# Patient Record
Sex: Female | Born: 1995 | Race: White | Hispanic: No | Smoking: Former smoker
Health system: Southern US, Community
[De-identification: ages and names within clinical notes are randomized; demographics above are authoritative.]

## PROBLEM LIST (undated history)

## (undated) DIAGNOSIS — F449 Dissociative and conversion disorder, unspecified: Secondary | ICD-10-CM

## (undated) DIAGNOSIS — M722 Plantar fascial fibromatosis: Secondary | ICD-10-CM

## (undated) DIAGNOSIS — R55 Syncope and collapse: Secondary | ICD-10-CM

## (undated) DIAGNOSIS — F419 Anxiety disorder, unspecified: Secondary | ICD-10-CM

## (undated) DIAGNOSIS — O99345 Other mental disorders complicating the puerperium: Secondary | ICD-10-CM

## (undated) DIAGNOSIS — I10 Essential (primary) hypertension: Secondary | ICD-10-CM

## (undated) DIAGNOSIS — G43909 Migraine, unspecified, not intractable, without status migrainosus: Secondary | ICD-10-CM

## (undated) DIAGNOSIS — M6281 Muscle weakness (generalized): Secondary | ICD-10-CM

## (undated) DIAGNOSIS — Z973 Presence of spectacles and contact lenses: Secondary | ICD-10-CM

## (undated) DIAGNOSIS — G894 Chronic pain syndrome: Secondary | ICD-10-CM

## (undated) DIAGNOSIS — G4700 Insomnia, unspecified: Secondary | ICD-10-CM

## (undated) DIAGNOSIS — M797 Fibromyalgia: Secondary | ICD-10-CM

## (undated) DIAGNOSIS — N3010 Interstitial cystitis (chronic) without hematuria: Secondary | ICD-10-CM

## (undated) DIAGNOSIS — F445 Conversion disorder with seizures or convulsions: Secondary | ICD-10-CM

## (undated) DIAGNOSIS — Z9989 Dependence on other enabling machines and devices: Secondary | ICD-10-CM

## (undated) DIAGNOSIS — F53 Postpartum depression: Secondary | ICD-10-CM

## (undated) DIAGNOSIS — J45909 Unspecified asthma, uncomplicated: Secondary | ICD-10-CM

## (undated) DIAGNOSIS — G629 Polyneuropathy, unspecified: Secondary | ICD-10-CM

## (undated) DIAGNOSIS — F4310 Post-traumatic stress disorder, unspecified: Secondary | ICD-10-CM

## (undated) DIAGNOSIS — R569 Unspecified convulsions: Secondary | ICD-10-CM

## (undated) HISTORY — DX: Polyneuropathy, unspecified: G62.9

## (undated) HISTORY — PX: CHOLECYSTECTOMY: SHX55

## (undated) HISTORY — PX: TUBAL LIGATION: SHX77

---
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
## 2013-12-24 ENCOUNTER — Other Ambulatory Visit: Payer: Self-pay

## 2013-12-24 ENCOUNTER — Encounter (HOSPITAL_COMMUNITY): Payer: Self-pay | Admitting: Emergency Medicine

## 2013-12-24 ENCOUNTER — Emergency Department (HOSPITAL_COMMUNITY)
Admission: EM | Admit: 2013-12-24 | Discharge: 2013-12-24 | Disposition: A | Discharge status: Home / Self Care | Payer: Self-pay | Attending: Emergency Medicine | Admitting: Emergency Medicine

## 2013-12-24 ENCOUNTER — Emergency Department (HOSPITAL_COMMUNITY): Payer: Medicaid Other

## 2013-12-24 DIAGNOSIS — Z3202 Encounter for pregnancy test, result negative: Secondary | ICD-10-CM | POA: Insufficient documentation

## 2013-12-24 DIAGNOSIS — R55 Syncope and collapse: Secondary | ICD-10-CM | POA: Insufficient documentation

## 2013-12-24 DIAGNOSIS — Z87891 Personal history of nicotine dependence: Secondary | ICD-10-CM | POA: Insufficient documentation

## 2013-12-24 DIAGNOSIS — Z8669 Personal history of other diseases of the nervous system and sense organs: Secondary | ICD-10-CM | POA: Insufficient documentation

## 2013-12-24 LAB — RAPID URINE DRUG SCREEN, HOSP PERFORMED
Amphetamines: NOT DETECTED
BARBITURATES: NOT DETECTED
Benzodiazepines: NOT DETECTED
Cocaine: NOT DETECTED
Opiates: NOT DETECTED
Tetrahydrocannabinol: NOT DETECTED

## 2013-12-24 LAB — COMPREHENSIVE METABOLIC PANEL
ALT: 14 U/L (ref 0–35)
AST: 20 U/L (ref 0–37)
Albumin: 4 g/dL (ref 3.5–5.2)
Alkaline Phosphatase: 65 U/L (ref 39–117)
BILIRUBIN TOTAL: 0.2 mg/dL — AB (ref 0.3–1.2)
BUN: 10 mg/dL (ref 6–23)
CHLORIDE: 100 meq/L (ref 96–112)
CO2: 30 meq/L (ref 19–32)
CREATININE: 0.69 mg/dL (ref 0.50–1.10)
Calcium: 9.1 mg/dL (ref 8.4–10.5)
GFR calc Af Amer: >90 mL/min (ref >90)
Glucose, Bld: 100 mg/dL — ABNORMAL HIGH (ref 70–99)
Potassium: 4.4 mEq/L (ref 3.7–5.3)
Sodium: 140 mEq/L (ref 137–147)
Total Protein: 7.8 g/dL (ref 6.0–8.3)

## 2013-12-24 LAB — CBC WITH DIFFERENTIAL/PLATELET
Basophils Absolute: 0 10*3/uL (ref 0.0–0.1)
Basophils Relative: 0 % (ref 0–1)
Eosinophils Absolute: 0.2 10*3/uL (ref 0.0–0.7)
Eosinophils Relative: 2 % (ref 0–5)
HCT: 40.5 % (ref 36.0–46.0)
Hemoglobin: 13.5 g/dL (ref 12.0–15.0)
LYMPHS PCT: 22 % (ref 12–46)
Lymphs Abs: 2.6 10*3/uL (ref 0.7–4.0)
MCH: 30.1 pg (ref 26.0–34.0)
MCHC: 33.3 g/dL (ref 30.0–36.0)
MCV: 90.4 fL (ref 78.0–100.0)
MONO ABS: 0.8 10*3/uL (ref 0.1–1.0)
Monocytes Relative: 7 % (ref 3–12)
NEUTROS ABS: 8.2 10*3/uL — AB (ref 1.7–7.7)
Neutrophils Relative %: 69 % (ref 43–77)
Platelets: 315 10*3/uL (ref 150–400)
RBC: 4.48 MIL/uL (ref 3.87–5.11)
RDW: 13.2 % (ref 11.5–15.5)
WBC: 11.8 10*3/uL — ABNORMAL HIGH (ref 4.0–10.5)

## 2013-12-24 LAB — URINALYSIS, ROUTINE W REFLEX MICROSCOPIC
BILIRUBIN URINE: NEGATIVE
GLUCOSE, UA: NEGATIVE mg/dL
Hgb urine dipstick: NEGATIVE
Ketones, ur: NEGATIVE mg/dL
Leukocytes, UA: NEGATIVE
Nitrite: NEGATIVE
PROTEIN: NEGATIVE mg/dL
Specific Gravity, Urine: 1.01 (ref 1.005–1.030)
Urobilinogen, UA: 0.2 mg/dL (ref 0.0–1.0)
pH: 6.5 (ref 5.0–8.0)

## 2013-12-24 LAB — HCG, QUANTITATIVE, PREGNANCY: hCG, Beta Chain, Quant, S: <1 m[IU]/mL (ref <5)

## 2013-12-24 LAB — PREGNANCY, URINE: PREG TEST UR: NEGATIVE

## 2013-12-24 LAB — D-DIMER, QUANTITATIVE: D-Dimer, Quant: <0.27 ug/mL-FEU (ref 0.00–0.48)

## 2013-12-24 LAB — TROPONIN I

## 2013-12-24 LAB — LIPASE, BLOOD: LIPASE: 40 U/L (ref 11–59)

## 2013-12-24 MED ORDER — IBUPROFEN 800 MG PO TABS
800.0000 mg | ORAL_TABLET | Freq: Once | ORAL | Status: AC
  Administered 2013-12-24: 800 mg via ORAL
  Filled 2013-12-24: qty 1

## 2013-12-24 MED ORDER — SODIUM CHLORIDE 0.9 % IV BOLUS (SEPSIS)
1000.0000 mL | Freq: Once | INTRAVENOUS | Status: AC
  Administered 2013-12-24: 1000 mL via INTRAVENOUS

## 2013-12-24 NOTE — ED Notes (Signed)
Patient is alert and oriented at this time. Talking and smiling at triage. Only complaint of pain is "I fell last night and hit my head on a big wooden TV."

## 2013-12-24 NOTE — ED Provider Notes (Signed)
CSN: [id number]     Arrival date & time 12/24/13  1956 History   First MD Initiated Contact with Patient 12/24/13 2011     Chief Complaint  Patient presents with  . Loss of Consciousness     (Consider location/radiation/quality/duration/timing/severity/associated sxs/prior Treatment) HPI Comments: Patient presents from home after syncopal episode. She states she was sitting on the ground and the next things she remembers she was lying on the ground. Her boyfriend states this is been going on for several months. She has had approximately 6 episodes of syncope in the past month. She is alert and oriented x3 now. She complains of a headache she hit her head last night when she passed out against the TV. She denies any past medical history. She did have a positive home pregnancy test 2 days ago. Last menstrual period was one month ago. She has a history of pseudoseizures and conversion disorder and used to be on medication by her report. No medications now. She denies any prodrome. No dizziness, lightheadedness, vision change, shortness of breath or chest pain. She does not have a doctor She reports no vomiting or diarrhea. Good by mouth intake..   Patient has vague when asked how long these episodes have been going on. She reports it was before Christmas. Her boyfriend reports he's been with her for one and a half months and she has "passed out" 6 or 7 times. She has not taken medication for pseudoseizures in 2 years. She reports having a previous echocardiogram, Holter monitor, EEG that were all unremarkable.  The history is provided by the patient, a relative and the EMS personnel.    Past Medical History  Diagnosis Date  . Seizures    History reviewed. No pertinent past surgical history. No family history on file. History  Substance Use Topics  . Smoking status: Former Research scientist (life sciences)  . Smokeless tobacco: Not on file  . Alcohol Use: No   OB History   Grav Para Term Preterm Abortions TAB SAB Ect  Mult Living   1              Review of Systems  Constitutional: Negative for fever, activity change and appetite change.  Respiratory: Negative for cough, chest tightness and shortness of breath.   Cardiovascular: Positive for syncope. Negative for chest pain.  Gastrointestinal: Negative for nausea, vomiting and abdominal pain.  Genitourinary: Negative for dysuria, hematuria, vaginal bleeding and vaginal discharge.  Musculoskeletal: Negative for arthralgias, back pain and myalgias.  Skin: Negative for rash.  Neurological: Negative for dizziness, weakness, light-headedness, numbness and headaches.  A complete 10 system review of systems was obtained and all systems are negative except as noted in the HPI and PMH.      Allergies  Review of patient's allergies indicates no known allergies.  Home Medications  No current outpatient prescriptions on file. BP 135/66  Pulse 102  Temp(Src) 98 F (36.7 C) (Oral)  Resp 16  Ht 5\' 4"  (1.626 m)  Wt 160 lb (72.576 kg)  BMI 27.45 kg/m2  SpO2 100%  LMP 11/01/2013  Breastfeeding? Unknown Physical Exam  Constitutional: She is oriented to person, place, and time. She appears well-developed and well-nourished. No distress.  HENT:  Head: Normocephalic and atraumatic.  Mouth/Throat: Oropharynx is clear and moist. No oropharyngeal exudate.  Eyes: Conjunctivae and EOM are normal. Pupils are equal, round, and reactive to light.  Neck: Normal range of motion. Neck supple.  Cardiovascular: Normal rate, regular rhythm and normal heart sounds.  No murmur heard. Pulmonary/Chest: Effort normal and breath sounds normal. No respiratory distress.  Abdominal: Soft. There is no tenderness. There is no rebound and no guarding.  Musculoskeletal: Normal range of motion. She exhibits no edema and no tenderness.  Neurological: She is alert and oriented to person, place, and time. No cranial nerve deficit. She exhibits normal muscle tone. Coordination normal.   CN 2-12 intact, no ataxia on finger to nose, no nystagmus, 5/5 strength throughout, no pronator drift, Romberg negative, normal gait.   Skin: Skin is warm.    ED Course  Procedures (including critical care time) Labs Review Labs Reviewed  CBC WITH DIFFERENTIAL - Abnormal; Notable for the following:    WBC 11.8 (*)    Neutro Abs 8.2 (*)    All other components within normal limits  COMPREHENSIVE METABOLIC PANEL - Abnormal; Notable for the following:    Glucose, Bld 100 (*)    Total Bilirubin 0.2 (*)    All other components within normal limits  URINALYSIS, ROUTINE W REFLEX MICROSCOPIC  PREGNANCY, URINE  LIPASE, BLOOD  TROPONIN I  HCG, QUANTITATIVE, PREGNANCY  D-DIMER, QUANTITATIVE  URINE RAPID DRUG SCREEN (HOSP PERFORMED)   Imaging Review Ct Head Wo Contrast  12/24/2013   CLINICAL DATA:  Syncope intermittently occurring for several months.  EXAM: CT HEAD WITHOUT CONTRAST  TECHNIQUE: Contiguous axial images were obtained from the base of the skull through the vertex without intravenous contrast.  COMPARISON:  CT HEAD W/O CM dated 12/06/2013; CT HEAD W/O CM dated 11/14/2011  FINDINGS: The ventricles and sulci are normal. No intraparenchymal hemorrhage, mass effect nor midline shift. No acute large vascular territory infarcts.  No abnormal extra-axial fluid collections. Basal cisterns are patent.  No skull fracture. Visualized paranasal sinuses and mastoid air-cells are well-aerated. The included ocular globes and orbital contents are non-suspicious. Soft tissue within the left external auditory canal likely reflect cerumen. Fullness of the nasopharyngeal soft tissues are attributable to patient's young age  IMPRESSION: No acute intracranial process ; normal noncontrast CT of the head, stable from December 06, 2013   Electronically Signed   By: Elon Alas   On: 12/24/2013 22:20    EKG Interpretation   None       MDM   Final diagnoses:  Syncope   multiple syncopal episodes  within the past two months without prodrome by report. History of conversion disorder and pseudoseizures. Possibly pregnant at this time. Denies preceding dizziness, lightheadedness, nausea, vision change, diaphoresis.  Nonfocal neuro exam. Vital stable. No distress. EKG shows no arrhythmia. No Brugada or prolonged QT.  Pregnancy test here is negative. Ddimer negative. Labs unremarkable. Orthostatics showed slight elevation in heart rate with standing but is not meet orthostatic hypotension criteria. Symptoms not reproduced with standing.  Unclear etiology of patient's "spells". Consider seizures, pseudoseizure, conversion disorder. Doubt arrhythmia. Doubt PE. No murmurs on exam. Patient reports previous echocardiogram was negative. Previous Holter monitoring and EEG were negative. Spells have been ongoing for several months if not longer. No evidence of acute change in symptomatology tonight. No evidence of Brugada syndrome or prolonged QT. Patient will be referred to both cardiology and neurology for further workup. Will need to establish care with PCP.   Date: 12/24/2013  Rate: 103  Rhythm: sinus tachycardia  QRS Axis: normal  Intervals: normal  ST/T Wave abnormalities: normal  Conduction Disutrbances:none  Narrative Interpretation: no brugada or prolonged QT  Old EKG Reviewed: none available    I personally performed the services described  in this documentation, which was scribed in my presence. The recorded information has been reviewed and is accurate.     Ezequiel Essex, MD 12/24/13 2258

## 2013-12-24 NOTE — Discharge Instructions (Signed)
Syncope There does not appear to be a life-threatening cause of your passing out. Followup with the cardiologist and neurologist for further testing. Establish care with a primary doctor. Return to the ED if you develop new or worsening symptoms. Syncope is a fainting spell. This means the person loses consciousness and drops to the ground. The person is generally unconscious for less than 5 minutes. The person may have some muscle twitches for up to 15 seconds before waking up and returning to normal. Syncope occurs more often in elderly people, but it can happen to anyone. While most causes of syncope are not dangerous, syncope can be a sign of a serious medical problem. It is important to seek medical care.  CAUSES  Syncope is caused by a sudden decrease in blood flow to the brain. The specific cause is often not determined. Factors that can trigger syncope include:  Taking medicines that lower blood pressure.  Sudden changes in posture, such as standing up suddenly.  Taking more medicine than prescribed.  Standing in one place for too long.  Seizure disorders.  Dehydration and excessive exposure to heat.  Low blood sugar (hypoglycemia).  Straining to have a bowel movement.  Heart disease, irregular heartbeat, or other circulatory problems.  Fear, emotional distress, seeing blood, or severe pain. SYMPTOMS  Right before fainting, you may:  Feel dizzy or lightheaded.  Feel nauseous.  See all white or all black in your field of vision.  Have cold, clammy skin. DIAGNOSIS  Your caregiver will ask about your symptoms, perform a physical exam, and perform electrocardiography (ECG) to record the electrical activity of your heart. Your caregiver may also perform other heart or blood tests to determine the cause of your syncope. TREATMENT  In most cases, no treatment is needed. Depending on the cause of your syncope, your caregiver may recommend changing or stopping some of your  medicines. HOME CARE INSTRUCTIONS  Have someone stay with you until you feel stable.  Do not drive, operate machinery, or play sports until your caregiver says it is okay.  Keep all follow-up appointments as directed by your caregiver.  Lie down right away if you start feeling like you might faint. Breathe deeply and steadily. Wait until all the symptoms have passed.  Drink enough fluids to keep your urine clear or pale yellow.  If you are taking blood pressure or heart medicine, get up slowly, taking several minutes to sit and then stand. This can reduce dizziness. SEEK IMMEDIATE MEDICAL CARE IF:   You have a severe headache.  You have unusual pain in the chest, abdomen, or back.  You are bleeding from the mouth or rectum, or you have black or tarry stool.  You have an irregular or very fast heartbeat.  You have pain with breathing.  You have repeated fainting or seizure-like jerking during an episode.  You faint when sitting or lying down.  You have confusion.  You have difficulty walking.  You have severe weakness.  You have vision problems. If you fainted, call your local emergency services (911 in [place] Do not drive yourself to the hospital.  MAKE SURE YOU:  Understand these instructions.  Will watch your condition.  Will get help right away if you are not doing well or get worse. Document Released: 10/17/2005 Document Revised: 04/17/2012 Document Reviewed: 12/16/2011 [facility] Patient Information 2014 [place].

## 2013-12-24 NOTE — ED Notes (Signed)
Patient presents to ER via [facility] with c/o syncope.  Per EMS, patient has no warning when she is going to have syncopal episode.   Patient states that this has been going on for several months and was seen at [facility] ED with no definitive answer.  Patient had witnessed syncopal episode tonight.  Patient is A&O on arrival to ED.

## 2014-01-05 ENCOUNTER — Emergency Department (HOSPITAL_COMMUNITY): Payer: Medicaid Other

## 2014-01-05 ENCOUNTER — Encounter (HOSPITAL_COMMUNITY): Payer: Self-pay | Admitting: Emergency Medicine

## 2014-01-05 ENCOUNTER — Emergency Department (HOSPITAL_COMMUNITY)
Admission: EM | Admit: 2014-01-05 | Discharge: 2014-01-05 | Disposition: A | Discharge status: Home / Self Care | Payer: Medicaid Other | Attending: Emergency Medicine | Admitting: Emergency Medicine

## 2014-01-05 DIAGNOSIS — Y939 Activity, unspecified: Secondary | ICD-10-CM | POA: Insufficient documentation

## 2014-01-05 DIAGNOSIS — Z87891 Personal history of nicotine dependence: Secondary | ICD-10-CM | POA: Insufficient documentation

## 2014-01-05 DIAGNOSIS — M542 Cervicalgia: Secondary | ICD-10-CM

## 2014-01-05 DIAGNOSIS — R55 Syncope and collapse: Secondary | ICD-10-CM

## 2014-01-05 DIAGNOSIS — IMO0002 Reserved for concepts with insufficient information to code with codable children: Secondary | ICD-10-CM | POA: Insufficient documentation

## 2014-01-05 DIAGNOSIS — H938X9 Other specified disorders of ear, unspecified ear: Secondary | ICD-10-CM | POA: Insufficient documentation

## 2014-01-05 DIAGNOSIS — Z3202 Encounter for pregnancy test, result negative: Secondary | ICD-10-CM | POA: Insufficient documentation

## 2014-01-05 DIAGNOSIS — S199XXA Unspecified injury of neck, initial encounter: Secondary | ICD-10-CM

## 2014-01-05 DIAGNOSIS — Y9289 Other specified places as the place of occurrence of the external cause: Secondary | ICD-10-CM | POA: Insufficient documentation

## 2014-01-05 DIAGNOSIS — Z8669 Personal history of other diseases of the nervous system and sense organs: Secondary | ICD-10-CM | POA: Insufficient documentation

## 2014-01-05 DIAGNOSIS — H538 Other visual disturbances: Secondary | ICD-10-CM | POA: Insufficient documentation

## 2014-01-05 DIAGNOSIS — R296 Repeated falls: Secondary | ICD-10-CM | POA: Insufficient documentation

## 2014-01-05 DIAGNOSIS — S0993XA Unspecified injury of face, initial encounter: Secondary | ICD-10-CM | POA: Insufficient documentation

## 2014-01-05 DIAGNOSIS — Z791 Long term (current) use of non-steroidal anti-inflammatories (NSAID): Secondary | ICD-10-CM | POA: Insufficient documentation

## 2014-01-05 DIAGNOSIS — M549 Dorsalgia, unspecified: Secondary | ICD-10-CM

## 2014-01-05 HISTORY — DX: Conversion disorder with seizures or convulsions: F44.5

## 2014-01-05 HISTORY — DX: Syncope and collapse: R55

## 2014-01-05 HISTORY — DX: Unspecified convulsions: R56.9

## 2014-01-05 HISTORY — DX: Dissociative and conversion disorder, unspecified: F44.9

## 2014-01-05 LAB — URINALYSIS, ROUTINE W REFLEX MICROSCOPIC
Bilirubin Urine: NEGATIVE
Glucose, UA: NEGATIVE mg/dL
Hgb urine dipstick: NEGATIVE
Ketones, ur: NEGATIVE mg/dL
Nitrite: NEGATIVE
Protein, ur: NEGATIVE mg/dL
Specific Gravity, Urine: 1.025 (ref 1.005–1.030)
UROBILINOGEN UA: 0.2 mg/dL (ref 0.0–1.0)
pH: 7 (ref 5.0–8.0)

## 2014-01-05 LAB — URINE MICROSCOPIC-ADD ON

## 2014-01-05 LAB — POC URINE PREG, ED: Preg Test, Ur: NEGATIVE

## 2014-01-05 MED ORDER — IBUPROFEN 800 MG PO TABS: 800.0000 mg | ORAL_TABLET | Freq: Three times a day (TID) | ORAL | Status: DC

## 2014-01-05 MED ORDER — IBUPROFEN 800 MG PO TABS
800.0000 mg | ORAL_TABLET | Freq: Once | ORAL | Status: AC
  Administered 2014-01-05: 800 mg via ORAL
  Filled 2014-01-05: qty 1

## 2014-01-05 MED ORDER — ACETAMINOPHEN 500 MG PO TABS: 500.0000 mg | ORAL_TABLET | Freq: Four times a day (QID) | ORAL | Status: DC | PRN

## 2014-01-05 NOTE — ED Notes (Signed)
Patient placed on continuous cardiac monitoring, continuous pulse 0x monitoring 

## 2014-01-05 NOTE — Discharge Instructions (Signed)
Please follow up with a primary care doctor from list below. Please follow up with the cardiologist and neurologist given to you at your last ER visit. Please take Tylenol and Motrin as prescribed for neck and back pain. Please read all discharge instructions and return precautions.   Syncope Syncope is a fainting spell. This means the person loses consciousness and drops to the ground. The person is generally unconscious for less than 5 minutes. The person may have some muscle twitches for up to 15 seconds before waking up and returning to normal. Syncope occurs more often in elderly people, but it can happen to anyone. While most causes of syncope are not dangerous, syncope can be a sign of a serious medical problem. It is important to seek medical care.  CAUSES  Syncope is caused by a sudden decrease in blood flow to the brain. The specific cause is often not determined. Factors that can trigger syncope include:  Taking medicines that lower blood pressure.  Sudden changes in posture, such as standing up suddenly.  Taking more medicine than prescribed.  Standing in one place for too long.  Seizure disorders.  Dehydration and excessive exposure to heat.  Low blood sugar (hypoglycemia).  Straining to have a bowel movement.  Heart disease, irregular heartbeat, or other circulatory problems.  Fear, emotional distress, seeing blood, or severe pain. SYMPTOMS  Right before fainting, you may:  Feel dizzy or lightheaded.  Feel nauseous.  See all white or all black in your field of vision.  Have cold, clammy skin. DIAGNOSIS  Your caregiver will ask about your symptoms, perform a physical exam, and perform electrocardiography (ECG) to record the electrical activity of your heart. Your caregiver may also perform other heart or blood tests to determine the cause of your syncope. TREATMENT  In most cases, no treatment is needed. Depending on the cause of your syncope, your caregiver may  recommend changing or stopping some of your medicines. HOME CARE INSTRUCTIONS  Have someone stay with you until you feel stable.  Do not drive, operate machinery, or play sports until your caregiver says it is okay.  Keep all follow-up appointments as directed by your caregiver.  Lie down right away if you start feeling like you might faint. Breathe deeply and steadily. Wait until all the symptoms have passed.  Drink enough fluids to keep your urine clear or pale yellow.  If you are taking blood pressure or heart medicine, get up slowly, taking several minutes to sit and then stand. This can reduce dizziness. SEEK IMMEDIATE MEDICAL CARE IF:   You have a severe headache.  You have unusual pain in the chest, abdomen, or back.  You are bleeding from the mouth or rectum, or you have black or tarry stool.  You have an irregular or very fast heartbeat.  You have pain with breathing.  You have repeated fainting or seizure-like jerking during an episode.  You faint when sitting or lying down.  You have confusion.  You have difficulty walking.  You have severe weakness.  You have vision problems. If you fainted, call your local emergency services (911 in [place] Do not drive yourself to the hospital.  MAKE SURE YOU:  Understand these instructions.  Will watch your condition.  Will get help right away if you are not doing well or get worse. Document Released: 10/17/2005 Document Revised: 04/17/2012 Document Reviewed: 12/16/2011 [facility] Patient Information 2014 [place].
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
## 2014-01-05 NOTE — ED Provider Notes (Signed)
Medical screening examination/treatment/procedure(s) were performed by non-physician practitioner and as supervising physician I was immediately available for consultation/collaboration.   EKG Interpretation None       Courtney F Horton, MD 01/05/14 1715 

## 2014-01-05 NOTE — ED Notes (Signed)
Pt reports was at [employer] yesterday and passed out in the parking lot and fell.  C/O pain to lower back, neck, and both shoulders.  Pt says doesn't remember anything since the fall until she woke up this morning.  Says when she woke up she couldn't see or hear anything.  Pt says now can see and hear but vision is blurry.  EMS reports was called out today for unresponsive.  Arrived to find pt nonverbal but responsive somewhat to ammonia and sternal rub.  EMS reports pt became verbal and was alert and oriented when got into the ambulance.  Pt says has conversion disorder and usually presents this way when she has an "episode."  Pt says the only difference this time is the visual changes.

## 2014-01-05 NOTE — ED Provider Notes (Signed)
CSN: [id number]     Arrival date & time 01/05/14  1406 History   First MD Initiated Contact with Patient 01/05/14 1417     Chief Complaint  Patient presents with  . Altered Mental Status     (Consider location/radiation/quality/duration/timing/severity/associated sxs/prior Treatment) HPI Comments: Patient is a 18 yo F PMHx significant for pseudoseizures, conversation disorder, syncope presenting to the ED for a syncopal episode last evening around 9:30PM while at [facility]. According to the patient's fiance the patient was in the parking lot when she fell to the ground. The patient states she does not remember the incident. She said she woke up this morning and noticed her hearing was muffled describing it as "water in my ears" and also endorses some blurred vision, she states she can still see that everything is not as "sharp as it is normally." Patient states that the syncopal episode is typical for her conversion disorder but the vision and hearing are new. Patient has had multiple episodes of syncope since being seen in the emergency department 2 weeks ago where she had an extensive negative workup done. Patient states she has not followed up with the cardiologist and/or neurologist has prescribed due to insurance reasons. She denies any chest pain, shortness of breath, abdominal pain, nausea, vomiting, diarrhea, numbness or weakness, eye pain. LMP was 2 months ago.  Patient is a 18 y.o. female presenting with altered mental status.  Altered Mental Status Associated symptoms: no abdominal pain, no fever, no nausea, no palpitations and no vomiting     Past Medical History  Diagnosis Date  . Pseudoseizures   . Conversion disorder   . Syncope     recurrent   History reviewed. No pertinent past surgical history. No family history on file. History  Substance Use Topics  . Smoking status: Former Research scientist (life sciences)  . Smokeless tobacco: Not on file  . Alcohol Use: No   OB History   Grav Para Term  Preterm Abortions TAB SAB Ect Mult Living   1              Review of Systems  Constitutional: Negative for fever and chills.  HENT:       Ear congestion  Eyes:       Blurred vision  Respiratory: Negative for cough, chest tightness and shortness of breath.   Cardiovascular: Negative for chest pain, palpitations and leg swelling.  Gastrointestinal: Negative for nausea, vomiting, abdominal pain and diarrhea.  Musculoskeletal: Positive for back pain and neck pain.  Neurological: Positive for syncope.  All other systems reviewed and are negative.      Allergies  Review of patient's allergies indicates no known allergies.  Home Medications   Current Outpatient Rx  Name  Route  Sig  Dispense  Refill  . diphenhydramine-acetaminophen (TYLENOL PM) 25-500 MG TABS   Oral   Take 1 tablet by mouth at bedtime as needed (sleep/pain).         Marland Kitchen acetaminophen (TYLENOL) 500 MG tablet   Oral   Take 1 tablet (500 mg total) by mouth every 6 (six) hours as needed.   30 tablet   0   . ibuprofen (ADVIL,MOTRIN) 800 MG tablet   Oral   Take 1 tablet (800 mg total) by mouth 3 (three) times daily.   21 tablet   0    BP 131/86  Pulse 96  Temp(Src) 98.1 F (36.7 C) (Oral)  Resp 25  Ht 5\' 4"  (1.626 m)  Wt 150 lb (68.04 kg)  BMI 25.73 kg/m2  SpO2 100%  LMP 11/01/2013 Physical Exam  Nursing note and vitals reviewed. Constitutional: She is oriented to person, place, and time. She appears well-developed and well-nourished. No distress.  HENT:  Head: Normocephalic and atraumatic.  Right Ear: Hearing, tympanic membrane, external ear and ear canal normal.  Left Ear: Hearing, tympanic membrane, external ear and ear canal normal.  Nose: Nose normal.  Mouth/Throat: Oropharynx is clear and moist. No oropharyngeal exudate.  Patient comprehending all questions appropriately, not asking for clarification. No evidence of decreased hearing.   Eyes: Conjunctivae, EOM and lids are normal. Pupils are  equal, round, and reactive to light. Right eye exhibits no chemosis, no discharge, no exudate and no hordeolum. No foreign body present in the right eye. Left eye exhibits no chemosis, no discharge, no exudate and no hordeolum. No foreign body present in the left eye. No scleral icterus.  Neck: Normal range of motion. Neck supple.  Cardiovascular: Normal rate, regular rhythm, normal heart sounds and intact distal pulses.   Pulmonary/Chest: Effort normal and breath sounds normal. No respiratory distress.  Abdominal: Soft. There is no tenderness.  Musculoskeletal: Normal range of motion. She exhibits no edema.  Neurological: She is alert and oriented to person, place, and time. She has normal strength. No cranial nerve deficit or sensory deficit. Gait normal. GCS eye subscore is 4. GCS verbal subscore is 5. GCS motor subscore is 6.  No pronator drift. Bilateral heel-knee-shin intact. Bilateral finger-nose-finger intact. Normal ambulation.  Skin: Skin is warm and dry. She is not diaphoretic.    ED Course  Procedures (including critical care time) Medications  ibuprofen (ADVIL,MOTRIN) tablet 800 mg (800 mg Oral Given 01/05/14 1445)    Labs Review Labs Reviewed  URINALYSIS, ROUTINE W REFLEX MICROSCOPIC - Abnormal; Notable for the following:    APPearance CLOUDY (*)    Leukocytes, UA TRACE (*)    All other components within normal limits  URINE MICROSCOPIC-ADD ON - Abnormal; Notable for the following:    Squamous Epithelial / LPF FEW (*)    Bacteria, UA FEW (*)    All other components within normal limits  POC URINE PREG, ED   Imaging Review Dg Cervical Spine Complete  01/05/2014   CLINICAL DATA:  Syncopal episode and neck pain.  EXAM: CERVICAL SPINE  4+ VIEWS  COMPARISON:  CT 03/16/2011  FINDINGS: There is no evidence of cervical spine fracture or prevertebral soft tissue swelling. Alignment is normal. No other significant bone abnormalities are identified.  IMPRESSION: Negative cervical spine  radiographs.   Electronically Signed   By: Markus Daft M.D.   On: 01/05/2014 15:42   Dg Lumbar Spine Complete  01/05/2014   CLINICAL DATA:  Lumbar pain after fall.  EXAM: LUMBAR SPINE - COMPLETE 4+ VIEW  COMPARISON:  03/29/2011  FINDINGS: Large amount of stool in abdomen. Normal alignment of the lumbar spine. The vertebral body heights are maintained. There is mild irregularity along the anterior inferior endplate of L5. This appears unchanged from the exam on 03/29/2011.  IMPRESSION: No acute bone abnormality in the lumbar spine.   Electronically Signed   By: Markus Daft M.D.   On: 01/05/2014 15:47     EKG Interpretation None      MDM   Final diagnoses:  Syncope  Neck pain  Back pain   Filed Vitals:   01/05/14 1414  BP: 131/86  Pulse: 96  Temp: 98.1 F (36.7 C)  Resp: 25    Afebrile, NAD, non-toxic  appearing, AAOx4.   4:13 PM Patient's fiance is asking about possible disability for the patient. He states "she can't work if she gets upset at someone she may have an episode..Also if me and my girl don't work out in the future I want to make sure she'll be set." Discussed with patient and fiance that she would require going through a PCP for this paperwork and this was not something that is done in the ED. They are agreeable to that. A resource guide will be provided to them.    Patient is a 18 yo F PMHx significant for conversion disorder, pseudoseizures, syncope presenting to the ED after a typical syncopal episode for her with new compliant of decreased vision and hearing. HEENT examination unremarkable. No acute abnormalities. No hearing loss appreciated during examination. VA is 20/70 in OD/OS, 20/50 bilateral. No gross deficit in [place]. No neurofocal deficits on examination. Paraspinous muscles are tender, but not posterior midline tenderness noted. No stepoffs. Patient was seen in ED on 12/24/13 with extensive work up including CT scans, blood work, EKG, Troponin, D-dimer, negative  pregnancy test, UDS without acute finding. EKG is again unremarkable today. NSR, no signs of arrhythmia or Brugadas. Negative pregnancy test. Xrays negative for acute process. Pain managed in ED. Do not feel patient will require further evaluation at this time. Symptoms managed in ED. Advised cardiology and neurology follow up again. Also advised PCP f/u. Patient is agreeable to plan. Patient is stable at time of discharge. Patient d/w with Dr. Dina Rich, agrees with plan.         Harlow Mares, PA-C 01/05/14 1629

## 2014-01-05 NOTE — ED Notes (Signed)
Patient ambulatory to restroom with assistance, clean catch instructions given and advised pt to bring specimen back to room as well.

## 2014-03-23 ENCOUNTER — Encounter (HOSPITAL_COMMUNITY): Payer: Self-pay | Admitting: Emergency Medicine

## 2014-03-23 ENCOUNTER — Emergency Department (HOSPITAL_COMMUNITY)
Admission: EM | Admit: 2014-03-23 | Discharge: 2014-03-24 | Disposition: A | Discharge status: Home / Self Care | Payer: Medicaid Other | Attending: Emergency Medicine | Admitting: Emergency Medicine

## 2014-03-23 DIAGNOSIS — R1031 Right lower quadrant pain: Secondary | ICD-10-CM | POA: Insufficient documentation

## 2014-03-23 DIAGNOSIS — R109 Unspecified abdominal pain: Secondary | ICD-10-CM

## 2014-03-23 DIAGNOSIS — Z791 Long term (current) use of non-steroidal anti-inflammatories (NSAID): Secondary | ICD-10-CM | POA: Insufficient documentation

## 2014-03-23 DIAGNOSIS — Z87891 Personal history of nicotine dependence: Secondary | ICD-10-CM | POA: Insufficient documentation

## 2014-03-23 DIAGNOSIS — Z3202 Encounter for pregnancy test, result negative: Secondary | ICD-10-CM | POA: Insufficient documentation

## 2014-03-23 DIAGNOSIS — R112 Nausea with vomiting, unspecified: Secondary | ICD-10-CM

## 2014-03-23 LAB — URINALYSIS, ROUTINE W REFLEX MICROSCOPIC
BILIRUBIN URINE: NEGATIVE
GLUCOSE, UA: NEGATIVE mg/dL
Hgb urine dipstick: NEGATIVE
KETONES UR: NEGATIVE mg/dL
Leukocytes, UA: NEGATIVE
NITRITE: NEGATIVE
PH: 6.5 (ref 5.0–8.0)
Protein, ur: NEGATIVE mg/dL
SPECIFIC GRAVITY, URINE: 1.015 (ref 1.005–1.030)
Urobilinogen, UA: 0.2 mg/dL (ref 0.0–1.0)

## 2014-03-23 LAB — CBC WITH DIFFERENTIAL/PLATELET
BASOS ABS: 0.1 10*3/uL (ref 0.0–0.1)
BASOS PCT: 1 % (ref 0–1)
Eosinophils Absolute: 0.2 10*3/uL (ref 0.0–0.7)
Eosinophils Relative: 2 % (ref 0–5)
HEMATOCRIT: 37.8 % (ref 36.0–46.0)
Hemoglobin: 12.3 g/dL (ref 12.0–15.0)
Lymphocytes Relative: 42 % (ref 12–46)
Lymphs Abs: 3.7 10*3/uL (ref 0.7–4.0)
MCH: 28.3 pg (ref 26.0–34.0)
MCHC: 32.5 g/dL (ref 30.0–36.0)
MCV: 87.1 fL (ref 78.0–100.0)
Monocytes Absolute: 0.5 10*3/uL (ref 0.1–1.0)
Monocytes Relative: 6 % (ref 3–12)
NEUTROS ABS: 4.4 10*3/uL (ref 1.7–7.7)
Neutrophils Relative %: 49 % (ref 43–77)
PLATELETS: 330 10*3/uL (ref 150–400)
RBC: 4.34 MIL/uL (ref 3.87–5.11)
RDW: 12.8 % (ref 11.5–15.5)
WBC: 9 10*3/uL (ref 4.0–10.5)

## 2014-03-23 LAB — COMPREHENSIVE METABOLIC PANEL
ALBUMIN: 3.8 g/dL (ref 3.5–5.2)
ALT: 16 U/L (ref 0–35)
AST: 17 U/L (ref 0–37)
Alkaline Phosphatase: 58 U/L (ref 39–117)
BUN: 12 mg/dL (ref 6–23)
CHLORIDE: 100 meq/L (ref 96–112)
CO2: 24 meq/L (ref 19–32)
Calcium: 8.8 mg/dL (ref 8.4–10.5)
Creatinine, Ser: 0.83 mg/dL (ref 0.50–1.10)
GFR calc Af Amer: >90 mL/min (ref >90)
Glucose, Bld: 95 mg/dL (ref 70–99)
Potassium: 3.8 mEq/L (ref 3.7–5.3)
SODIUM: 136 meq/L — AB (ref 137–147)
Total Protein: 6.9 g/dL (ref 6.0–8.3)

## 2014-03-23 LAB — POC URINE PREG, ED: Preg Test, Ur: NEGATIVE

## 2014-03-23 LAB — LIPASE, BLOOD: Lipase: 38 U/L (ref 11–59)

## 2014-03-23 MED ORDER — OXYCODONE-ACETAMINOPHEN 5-325 MG PO TABS: 1.0000 | ORAL_TABLET | ORAL | Status: DC | PRN

## 2014-03-23 MED ORDER — SODIUM CHLORIDE 0.9 % IV SOLN: INTRAVENOUS | Status: DC

## 2014-03-23 MED ORDER — SODIUM CHLORIDE 0.9 % IV BOLUS (SEPSIS)
1000.0000 mL | Freq: Once | INTRAVENOUS | Status: AC
  Administered 2014-03-23: 1000 mL via INTRAVENOUS

## 2014-03-23 MED ORDER — ONDANSETRON HCL 4 MG/2ML IJ SOLN
4.0000 mg | Freq: Once | INTRAMUSCULAR | Status: AC
  Administered 2014-03-23: 4 mg via INTRAVENOUS
  Filled 2014-03-23: qty 2

## 2014-03-23 MED ORDER — OXYCODONE-ACETAMINOPHEN 5-325 MG PO TABS
2.0000 | ORAL_TABLET | Freq: Once | ORAL | Status: AC
  Administered 2014-03-23: 2 via ORAL
  Filled 2014-03-23: qty 2

## 2014-03-23 MED ORDER — NAPROXEN 500 MG PO TABS: 500.0000 mg | ORAL_TABLET | Freq: Two times a day (BID) | ORAL | Status: DC

## 2014-03-23 NOTE — ED Notes (Signed)
Vomiting since 1pm, states there is dark red blood in vomit

## 2014-03-23 NOTE — Discharge Instructions (Signed)
Please call your doctor for a followup appointment within 24-48 hours. When you talk to your doctor please let them know that you were seen in the emergency department and have them acquire all of your records so that they can discuss the findings with you and formulate a treatment plan to fully care for your new and ongoing problems. ° °

## 2014-03-23 NOTE — ED Notes (Signed)
MD at bedside. 

## 2014-03-23 NOTE — ED Provider Notes (Signed)
CSN: [id number]     Arrival date & time 03/23/14  2152 History  This chart was scribed for Grace Acosta, MD by Roxan Diesel, ED scribe.  This patient was seen in room [facility] and the patient's care was started at 11:27 PM.   Chief Complaint  Patient presents with  . Emesis    The history is provided by the patient. No language interpreter was used.    HPI Comments: Grace Sheppard is a 18 y.o. female who presents to the Emergency Department complaining of vomiting that began at 1 PM with associated abdominal pain.  Pt states she has had an intermittent headache for several days which was relieved by Excedrin.  She felt well on waking up this morning.and after eating eggs for breakfast this morning she continued to feel fine throughout church and when she arrived home at 12:30 PM.  However at 1 PM she became nauseated and began vomiting.  She has continued vomiting throughout the day and has noticed some dark red blood in her vomit as well.  She has been unable to tolerate foods or fluid.  After she began vomiting she also developed moderate sharp abdominal pain localized under her left ribs that radiates around to her right lower back.  Pain is worsened by getting up and walking and subsides slightly when she lies down.  Currently she rates pain at 5/10.  Pt also reports associated mild difficulty urinating and mild sore throat.  She denies fevers, chills, cough, SOB, diarrhea, blood in stools, leg swelling, changes in vision, hematuria, or vaginal discharge.  She denies h/o abdominal surgery.   Past Medical History  Diagnosis Date  . Pseudoseizures   . Conversion disorder   . Syncope     recurrent    History reviewed. No pertinent past surgical history.  No family history on file.   History  Substance Use Topics  . Smoking status: Former Research scientist (life sciences)  . Smokeless tobacco: Not on file  . Alcohol Use: No    OB History   Grav Para Term Preterm Abortions TAB SAB Ect Mult Living   1                Review of Systems  All other systems reviewed and are negative.     Allergies  Review of patient's allergies indicates no known allergies.  Home Medications   Prior to Admission medications   Medication Sig Start Date End Date Taking? Authorizing Provider  acetaminophen (TYLENOL) 500 MG tablet Take 1 tablet (500 mg total) by mouth every 6 (six) hours as needed. 01/05/14   Jennifer L Piepenbrink, PA-C  diphenhydramine-acetaminophen (TYLENOL PM) 25-500 MG TABS Take 1 tablet by mouth at bedtime as needed (sleep/pain).    Historical Provider, MD  ibuprofen (ADVIL,MOTRIN) 800 MG tablet Take 1 tablet (800 mg total) by mouth 3 (three) times daily. 01/05/14   Jennifer L Piepenbrink, PA-C   BP 118/74  Pulse 89  Temp(Src) 98.1 F (36.7 C) (Oral)  Resp 18  Ht 5\' 4"  (1.626 m)  Wt 158 lb (71.668 kg)  BMI 27.11 kg/m2  SpO2 99%  LMP 10/31/2013  Breastfeeding? No  Physical Exam  Nursing note and vitals reviewed. Constitutional: She appears well-developed and well-nourished. No distress.  HENT:  Head: Normocephalic and atraumatic.  Mouth/Throat: Oropharynx is clear and moist. No oropharyngeal exudate.  Eyes: Conjunctivae and EOM are normal. Pupils are equal, round, and reactive to light. Right eye exhibits no discharge. Left eye exhibits no discharge.  No scleral icterus.  Neck: Normal range of motion. Neck supple. No JVD present. No thyromegaly present.  Cardiovascular: Normal rate, regular rhythm, normal heart sounds and intact distal pulses.  Exam reveals no gallop and no friction rub.   No murmur heard. Pulmonary/Chest: Effort normal and breath sounds normal. No respiratory distress. She has no wheezes. She has no rales.  Abdominal: Soft. Bowel sounds are normal. She exhibits no distension and no mass. There is tenderness. There is no guarding.  Minimal tenderness in right lower quadrant, no guarding. Very soft. Mild right CVA tenderness.  Musculoskeletal: Normal range  of motion. She exhibits no edema and no tenderness.  Lymphadenopathy:    She has no cervical adenopathy.  Neurological: She is alert. Coordination normal.  Skin: Skin is warm and dry. No rash noted. No erythema.  Psychiatric: She has a normal mood and affect. Her behavior is normal.    ED Course  Procedures (including critical care time)  DIAGNOSTIC STUDIES: Oxygen Saturation is 99% on room air, normal by my interpretation.    COORDINATION OF CARE: 11:33 PM-Discussed treatment plan which includes pain medication with pt at bedside and pt agreed to plan.     Labs Review Labs Reviewed  COMPREHENSIVE METABOLIC PANEL - Abnormal; Notable for the following:    Sodium 136 (*)    Total Bilirubin <0.2 (*)    All other components within normal limits  CBC WITH DIFFERENTIAL  LIPASE, BLOOD  URINALYSIS, ROUTINE W REFLEX MICROSCOPIC  POC URINE PREG, ED    Imaging Review No results found.   MDM   Final diagnoses:  Abdominal pain  Nausea and vomiting    Pt has some mild RLQ ttp but declines CT scan and has opted to return in the AM for a CT if still having pain - she has normal labs and VS and at this time - states sx have almost completely resolved.  Doubt GB or liver issues, no pancreatitis - not pregnant.  Meds given in ED:  Medications  0.9 %  sodium chloride infusion (not administered)  oxyCODONE-acetaminophen (PERCOCET/ROXICET) 5-325 MG per tablet 2 tablet (not administered)  sodium chloride 0.9 % bolus 1,000 mL (1,000 mLs Intravenous New Bag/Given 03/23/14 2244)  ondansetron (ZOFRAN) injection 4 mg (4 mg Intravenous Given 03/23/14 2244)    New Prescriptions   NAPROXEN (NAPROSYN) 500 MG TABLET    Take 1 tablet (500 mg total) by mouth 2 (two) times daily with a meal.   OXYCODONE-ACETAMINOPHEN (PERCOCET) 5-325 MG PER TABLET    Take 1 tablet by mouth every 4 (four) hours as needed.      I personally performed the services described in this documentation, which was  scribed in my presence. The recorded information has been reviewed and is accurate.      Grace Acosta, MD 03/23/14 [phone]

## 2014-09-01 ENCOUNTER — Encounter (HOSPITAL_COMMUNITY): Payer: Self-pay | Admitting: Emergency Medicine

## 2015-07-27 ENCOUNTER — Emergency Department (HOSPITAL_COMMUNITY)
Admission: EM | Admit: 2015-07-27 | Discharge: 2015-07-27 | Disposition: A | Discharge status: Home / Self Care | Payer: Medicaid Other | Attending: Emergency Medicine | Admitting: Emergency Medicine

## 2015-07-27 ENCOUNTER — Encounter (HOSPITAL_COMMUNITY): Payer: Self-pay | Admitting: *Deleted

## 2015-07-27 DIAGNOSIS — R3915 Urgency of urination: Secondary | ICD-10-CM | POA: Insufficient documentation

## 2015-07-27 DIAGNOSIS — R300 Dysuria: Secondary | ICD-10-CM | POA: Insufficient documentation

## 2015-07-27 DIAGNOSIS — R102 Pelvic and perineal pain: Secondary | ICD-10-CM | POA: Insufficient documentation

## 2015-07-27 DIAGNOSIS — Z791 Long term (current) use of non-steroidal anti-inflammatories (NSAID): Secondary | ICD-10-CM | POA: Insufficient documentation

## 2015-07-27 DIAGNOSIS — Z87891 Personal history of nicotine dependence: Secondary | ICD-10-CM | POA: Insufficient documentation

## 2015-07-27 DIAGNOSIS — N939 Abnormal uterine and vaginal bleeding, unspecified: Secondary | ICD-10-CM | POA: Insufficient documentation

## 2015-07-27 DIAGNOSIS — R110 Nausea: Secondary | ICD-10-CM | POA: Insufficient documentation

## 2015-07-27 DIAGNOSIS — Z3202 Encounter for pregnancy test, result negative: Secondary | ICD-10-CM | POA: Insufficient documentation

## 2015-07-27 DIAGNOSIS — R350 Frequency of micturition: Secondary | ICD-10-CM | POA: Insufficient documentation

## 2015-07-27 DIAGNOSIS — Z8659 Personal history of other mental and behavioral disorders: Secondary | ICD-10-CM | POA: Insufficient documentation

## 2015-07-27 DIAGNOSIS — R21 Rash and other nonspecific skin eruption: Secondary | ICD-10-CM | POA: Insufficient documentation

## 2015-07-27 DIAGNOSIS — Z975 Presence of (intrauterine) contraceptive device: Secondary | ICD-10-CM | POA: Insufficient documentation

## 2015-07-27 LAB — URINALYSIS, ROUTINE W REFLEX MICROSCOPIC
Bilirubin Urine: NEGATIVE
GLUCOSE, UA: NEGATIVE mg/dL
HGB URINE DIPSTICK: NEGATIVE
KETONES UR: NEGATIVE mg/dL
LEUKOCYTES UA: NEGATIVE
Nitrite: NEGATIVE
PROTEIN: NEGATIVE mg/dL
Specific Gravity, Urine: 1.02 (ref 1.005–1.030)
Urobilinogen, UA: 0.2 mg/dL (ref 0.0–1.0)
pH: 7 (ref 5.0–8.0)

## 2015-07-27 LAB — CBC WITH DIFFERENTIAL/PLATELET
Basophils Absolute: 0 10*3/uL (ref 0.0–0.1)
Basophils Relative: 0 %
EOS ABS: 0.2 10*3/uL (ref 0.0–0.7)
Eosinophils Relative: 2 %
HCT: 39.4 % (ref 36.0–46.0)
Hemoglobin: 12.7 g/dL (ref 12.0–15.0)
LYMPHS ABS: 3.5 10*3/uL (ref 0.7–4.0)
Lymphocytes Relative: 38 %
MCH: 27.1 pg (ref 26.0–34.0)
MCHC: 32.2 g/dL (ref 30.0–36.0)
MCV: 84 fL (ref 78.0–100.0)
Monocytes Absolute: 0.5 10*3/uL (ref 0.1–1.0)
Monocytes Relative: 5 %
Neutro Abs: 4.9 10*3/uL (ref 1.7–7.7)
Neutrophils Relative %: 55 %
Platelets: 320 10*3/uL (ref 150–400)
RBC: 4.69 MIL/uL (ref 3.87–5.11)
RDW: 14.5 % (ref 11.5–15.5)
WBC: 9.2 10*3/uL (ref 4.0–10.5)

## 2015-07-27 LAB — COMPREHENSIVE METABOLIC PANEL
ALK PHOS: 52 U/L (ref 38–126)
ALT: 26 U/L (ref 14–54)
AST: 22 U/L (ref 15–41)
Albumin: 4.7 g/dL (ref 3.5–5.0)
Anion gap: 9 (ref 5–15)
BILIRUBIN TOTAL: 0.3 mg/dL (ref 0.3–1.2)
BUN: 14 mg/dL (ref 6–20)
CO2: 26 mmol/L (ref 22–32)
CREATININE: 0.69 mg/dL (ref 0.44–1.00)
Calcium: 9.1 mg/dL (ref 8.9–10.3)
Chloride: 102 mmol/L (ref 101–111)
Glucose, Bld: 89 mg/dL (ref 65–99)
Potassium: 4 mmol/L (ref 3.5–5.1)
Sodium: 137 mmol/L (ref 135–145)
Total Protein: 7.9 g/dL (ref 6.5–8.1)

## 2015-07-27 LAB — POC URINE PREG, ED: Preg Test, Ur: NEGATIVE

## 2015-07-27 LAB — WET PREP, GENITAL
Clue Cells Wet Prep HPF POC: NONE SEEN
TRICH WET PREP: NONE SEEN
Yeast Wet Prep HPF POC: NONE SEEN

## 2015-07-27 LAB — LIPASE, BLOOD: LIPASE: 36 U/L (ref 22–51)

## 2015-07-27 MED ORDER — AZITHROMYCIN 1 G PO PACK
1.0000 g | PACK | Freq: Once | ORAL | Status: AC
  Administered 2015-07-27: 1 g via ORAL
  Filled 2015-07-27: qty 1

## 2015-07-27 MED ORDER — CEFTRIAXONE SODIUM 250 MG IJ SOLR
250.0000 mg | Freq: Once | INTRAMUSCULAR | Status: AC
  Administered 2015-07-27: 250 mg via INTRAMUSCULAR
  Filled 2015-07-27: qty 250

## 2015-07-27 MED ORDER — LIDOCAINE HCL (PF) 1 % IJ SOLN
INTRAMUSCULAR | Status: AC
  Administered 2015-07-27: 5 mL
  Filled 2015-07-27: qty 5

## 2015-07-27 MED ORDER — MORPHINE SULFATE (PF) 4 MG/ML IV SOLN
4.0000 mg | Freq: Once | INTRAVENOUS | Status: AC
  Administered 2015-07-27: 4 mg via INTRAVENOUS
  Filled 2015-07-27: qty 1

## 2015-07-27 MED ORDER — DOXYCYCLINE HYCLATE 100 MG PO CAPS: 100.0000 mg | ORAL_CAPSULE | Freq: Two times a day (BID) | ORAL | Status: DC

## 2015-07-27 MED ORDER — TRAMADOL HCL 50 MG PO TABS: 50.0000 mg | ORAL_TABLET | Freq: Four times a day (QID) | ORAL | Status: DC | PRN

## 2015-07-27 MED ORDER — DOXYCYCLINE HYCLATE 100 MG PO TABS
100.0000 mg | ORAL_TABLET | Freq: Once | ORAL | Status: AC
  Administered 2015-07-27: 100 mg via ORAL
  Filled 2015-07-27: qty 1

## 2015-07-27 MED ORDER — ONDANSETRON HCL 4 MG/2ML IJ SOLN
4.0000 mg | Freq: Once | INTRAMUSCULAR | Status: AC
  Administered 2015-07-27: 4 mg via INTRAVENOUS
  Filled 2015-07-27: qty 2

## 2015-07-27 NOTE — ED Provider Notes (Signed)
CSN: [id number]     Arrival date & time 07/27/15  0118 History   First MD Initiated Contact with Patient 07/27/15 0135     Chief Complaint  Patient presents with  . Abdominal Pain     (Consider location/radiation/quality/duration/timing/severity/associated sxs/prior Treatment) Patient is a 19 y.o. female presenting with abdominal pain. The history is provided by the patient.  Abdominal Pain She complains of pain across her suprapubic area since this morning. Pain is sharp and crampy and she rates it at 5/10. It is worse with standing but nothing makes it better. There is associated nausea but no vomiting. She had some mild vaginal bleeding this morning but that has resolved. She denies any vaginal discharge. There has been some dysuria and urinary urgency and frequency. She denies fever, chills, sweats. She has not taken anything for pain. She is 3 months postpartum and states that she has not had normal menses since having baby. She does have a Mirena ring IUD in place.  Separate complaint, she has had an itchy rash on her arms and legs for the last 3 months. She denies any unusual exposures. She has not tried any treatment for it.  Past Medical History  Diagnosis Date  . Pseudoseizures   . Conversion disorder   . Syncope     recurrent   History reviewed. No pertinent past surgical history. No family history on file. Social History  Substance Use Topics  . Smoking status: Former Research scientist (life sciences)  . Smokeless tobacco: None  . Alcohol Use: No   OB History    Gravida Para Term Preterm AB TAB SAB Ectopic Multiple Living   1              Review of Systems  Gastrointestinal: Positive for abdominal pain.  All other systems reviewed and are negative.     Allergies  Review of patient's allergies indicates no known allergies.  Home Medications   Prior to Admission medications   Medication Sig Start Date End Date Taking? Authorizing Provider  diphenhydramine-acetaminophen (TYLENOL PM)  25-500 MG TABS Take 1 tablet by mouth at bedtime as needed (sleep/pain).    Historical Provider, MD  naproxen (NAPROSYN) 500 MG tablet Take 1 tablet (500 mg total) by mouth 2 (two) times daily with a meal. 03/23/14   Noemi Chapel, MD  oxyCODONE-acetaminophen (PERCOCET) 5-325 MG per tablet Take 1 tablet by mouth every 4 (four) hours as needed. 03/23/14   Noemi Chapel, MD   BP 147/103 mmHg  Pulse 78  Temp(Src) 97.8 F (36.6 C) (Oral)  Resp 18  Ht 5\' 4"  (1.626 m)  Wt 170 lb (77.111 kg)  BMI 29.17 kg/m2  SpO2 99%  LMP 07/26/2015 Physical Exam  Nursing note and vitals reviewed.  19 year old female, resting comfortably and in no acute distress. Vital signs are significant for hypertension. Oxygen saturation is 99%, which is normal. Head is normocephalic and atraumatic. PERRLA, EOMI. Oropharynx is clear. Neck is nontender and supple without adenopathy or JVD. Back is nontender and there is no CVA tenderness. Lungs are clear without rales, wheezes, or rhonchi. Chest is nontender. Heart has regular rate and rhythm without murmur. Abdomen is soft, flat, with mild tenderness in the suprapubic and right suprapubic areas. There is no rebound or guarding. There are no masses or hepatosplenomegaly and peristalsis is hypoactive. Pelvic: Normal external female genitalia. On speculum exam, IUD is noted to have been extruded. There is a small amount of white vaginal discharge present. On bimanual exam, there  is mild cervical motion tenderness and also tenderness of the right adnexa. No adnexal masses are felt. Fundus is normal size and position. Extremities have no cyanosis or edema, full range of motion is present. Skin is warm and dry. Maculopapular rashes present on arms and legs but very nonspecific in appearance. Neurologic: Mental status is normal, cranial nerves are intact, there are no motor or sensory deficits.  ED Course  Procedures (including critical care time) Labs Review Results for orders  placed or performed during the hospital encounter of 07/27/15  Wet prep, genital  Result Value Ref Range   Yeast Wet Prep HPF POC NONE SEEN NONE SEEN   Trich, Wet Prep NONE SEEN NONE SEEN   Clue Cells Wet Prep HPF POC NONE SEEN NONE SEEN   WBC, Wet Prep HPF POC FEW (A) NONE SEEN  Comprehensive metabolic panel  Result Value Ref Range   Sodium 137 135 - 145 mmol/L   Potassium 4.0 3.5 - 5.1 mmol/L   Chloride 102 101 - 111 mmol/L   CO2 26 22 - 32 mmol/L   Glucose, Bld 89 65 - 99 mg/dL   BUN 14 6 - 20 mg/dL   Creatinine, Ser 0.69 0.44 - 1.00 mg/dL   Calcium 9.1 8.9 - 10.3 mg/dL   Total Protein 7.9 6.5 - 8.1 g/dL   Albumin 4.7 3.5 - 5.0 g/dL   AST 22 15 - 41 U/L   ALT 26 14 - 54 U/L   Alkaline Phosphatase 52 38 - 126 U/L   Total Bilirubin 0.3 0.3 - 1.2 mg/dL   GFR calc non Af Amer >60 >60 mL/min   GFR calc Af Amer >60 >60 mL/min   Anion gap 9 5 - 15  Lipase, blood  Result Value Ref Range   Lipase 36 22 - 51 U/L  CBC with Differential  Result Value Ref Range   WBC 9.2 4.0 - 10.5 K/uL   RBC 4.69 3.87 - 5.11 MIL/uL   Hemoglobin 12.7 12.0 - 15.0 g/dL   HCT 39.4 36.0 - 46.0 %   MCV 84.0 78.0 - 100.0 fL   MCH 27.1 26.0 - 34.0 pg   MCHC 32.2 30.0 - 36.0 g/dL   RDW 14.5 11.5 - 15.5 %   Platelets 320 150 - 400 K/uL   Neutrophils Relative % 55 %   Neutro Abs 4.9 1.7 - 7.7 K/uL   Lymphocytes Relative 38 %   Lymphs Abs 3.5 0.7 - 4.0 K/uL   Monocytes Relative 5 %   Monocytes Absolute 0.5 0.1 - 1.0 K/uL   Eosinophils Relative 2 %   Eosinophils Absolute 0.2 0.0 - 0.7 K/uL   Basophils Relative 0 %   Basophils Absolute 0.0 0.0 - 0.1 K/uL  Urinalysis, Routine w reflex microscopic  Result Value Ref Range   Color, Urine YELLOW YELLOW   APPearance HAZY (A) CLEAR   Specific Gravity, Urine 1.020 1.005 - 1.030   pH 7.0 5.0 - 8.0   Glucose, UA NEGATIVE NEGATIVE mg/dL   Hgb urine dipstick NEGATIVE NEGATIVE   Bilirubin Urine NEGATIVE NEGATIVE   Ketones, ur NEGATIVE NEGATIVE mg/dL    Protein, ur NEGATIVE NEGATIVE mg/dL   Urobilinogen, UA 0.2 0.0 - 1.0 mg/dL   Nitrite NEGATIVE NEGATIVE   Leukocytes, UA NEGATIVE NEGATIVE  POC urine preg, ED  Result Value Ref Range   Preg Test, Ur NEGATIVE NEGATIVE   I have personally reviewed and evaluated these lab results as part of my medical decision-making.  MDM   Final diagnoses:  Pelvic pain in female    Pelvic pain of uncertain cause. Urinalysis will be obtained to evaluate for possible UTI. Tenderness is significantly lower than what could be expected for appendicitis. Old records are reviewed and she had a similar presentation one year ago and refuses CT scan at that time but symptoms resolved spontaneously. Pruritic rash of uncertain cause.  On pelvic exam, it is noted that her IUD has been completely extruded. Exam is consistent with early PID and she is started on ceftriaxone and azithromycin in the ED and is discharged with prescription for doxycycline. She is also given a prescription for tramadol. She is to follow-up with her gynecologist to discuss reinserting IUD versus using alternative method of contraception. Wet prep shows no other significant infections.   Delora Fuel, MD 97/58/83 [id number]

## 2015-07-27 NOTE — ED Notes (Signed)
Pt given something to drink, urine specimen sent to lab

## 2015-07-27 NOTE — ED Notes (Signed)
Pt c/o lower abd pain that started today, admits to nausea, denies any vomiting or diarrhea.

## 2015-07-27 NOTE — ED Notes (Signed)
Pt's IUD removed by Dr. Roxanne Mins, IUD was lying in vaginal canal; IUD placed in specimen bag and given to pt for her to give to her OB/GYN

## 2015-07-27 NOTE — Discharge Instructions (Signed)
Take ibuprofen for less severe pain.  See your gynecologist about whether to re-insert your IUD, or whether you should use some other form of contraception.  Pelvic Pain Female pelvic pain can be caused by many different things and start from a variety of places. Pelvic pain refers to pain that is located in the lower half of the abdomen and between your hips. The pain may occur over a short period of time (acute) or may be reoccurring (chronic). The cause of pelvic pain may be related to disorders affecting the female reproductive organs (gynecologic), but it may also be related to the bladder, kidney stones, an intestinal complication, or muscle or skeletal problems. Getting help right away for pelvic pain is important, especially if there has been severe, sharp, or a sudden onset of unusual pain. It is also important to get help right away because some types of pelvic pain can be life threatening.  CAUSES  Below are only some of the causes of pelvic pain. The causes of pelvic pain can be in one of several categories.   Gynecologic.  Pelvic inflammatory disease.  Sexually transmitted infection.  Ovarian cyst or a twisted ovarian ligament (ovarian torsion).  Uterine lining that grows outside the uterus (endometriosis).  Fibroids, cysts, or tumors.  Ovulation.  Pregnancy.  Pregnancy that occurs outside the uterus (ectopic pregnancy).  Miscarriage.  Labor.  Abruption of the placenta or ruptured uterus.  Infection.  Uterine infection (endometritis).  Bladder infection.  Diverticulitis.  Miscarriage related to a uterine infection (septic abortion).  Bladder.  Inflammation of the bladder (cystitis).  Kidney stone(s).  Gastrointestinal.  Constipation.  Diverticulitis.  Neurologic.  Trauma.  Feeling pelvic pain because of mental or emotional causes (psychosomatic).  Cancers of the bowel or pelvis. EVALUATION  Your caregiver will want to take a careful history  of your concerns. This includes recent changes in your health, a careful gynecologic history of your periods (menses), and a sexual history. Obtaining your family history and medical history is also important. Your caregiver may suggest a pelvic exam. A pelvic exam will help identify the location and severity of the pain. It also helps in the evaluation of which organ system may be involved. In order to identify the cause of the pelvic pain and be properly treated, your caregiver may order tests. These tests may include:   A pregnancy test.  Pelvic ultrasonography.  An X-ray exam of the abdomen.  A urinalysis or evaluation of vaginal discharge.  Blood tests. HOME CARE INSTRUCTIONS   Only take over-the-counter or prescription medicines for pain, discomfort, or fever as directed by your caregiver.   Rest as directed by your caregiver.   Eat a balanced diet.   Drink enough fluids to make your urine clear or pale yellow, or as directed.   Avoid sexual intercourse if it causes pain.   Apply warm or cold compresses to the lower abdomen depending on which one helps the pain.   Avoid stressful situations.   Keep a journal of your pelvic pain. Write down when it started, where the pain is located, and if there are things that seem to be associated with the pain, such as food or your menstrual cycle.  Follow up with your caregiver as directed.  SEEK MEDICAL CARE IF:  Your medicine does not help your pain.  You have abnormal vaginal discharge. SEEK IMMEDIATE MEDICAL CARE IF:   You have heavy bleeding from the vagina.   Your pelvic pain increases.   You  feel light-headed or faint.   You have chills.   You have pain with urination or blood in your urine.   You have uncontrolled diarrhea or vomiting.   You have a fever or persistent symptoms for more than 3 days.  You have a fever and your symptoms suddenly get worse.   You are being physically or sexually  abused.  MAKE SURE YOU:  Understand these instructions.  Will watch your condition.  Will get help if you are not doing well or get worse. Document Released: 09/13/2004 Document Revised: 03/03/2014 Document Reviewed: 02/06/2012 [facility] Patient Information 2015 [place], [place]. This information is not intended to replace advice given to you by your health care provider. Make sure you discuss any questions you have with your health care provider.  Pelvic Inflammatory Disease Pelvic inflammatory disease (PID) refers to an infection in some or all of the female organs. The infection can be in the uterus, ovaries, fallopian tubes, or the surrounding tissues in the pelvis. PID can cause abdominal or pelvic pain that comes on suddenly (acute pelvic pain). PID is a serious infection because it can lead to lasting (chronic) pelvic pain or the inability to have children (infertile).  CAUSES  The infection is often caused by the normal bacteria found in the vaginal tissues. PID may also be caused by an infection that is spread during sexual contact. PID can also occur following:   The birth of a baby.   A miscarriage.   An abortion.   Major pelvic surgery.   The use of an intrauterine device (IUD).   A sexual assault.  RISK FACTORS Certain factors can put a person at higher risk for PID, such as:  Being younger than 25 years.  Being sexually active at [place] age.  Usingnonbarrier contraception.  Havingmultiple sexual partners.  Having sex with someone who has symptoms of a genital infection.  Using oral contraception. Other times, certain behaviors can increase the possibility of getting PID, such as:  Having sex during your period.  Using a vaginal douche.  Having an intrauterine device (IUD) in place. SYMPTOMS   Abdominal or pelvic pain.   Fever.   Chills.   Abnormal vaginal discharge.  Abnormal uterine bleeding.   Unusual pain shortly after finishing  your period. DIAGNOSIS  Your caregiver will choose some of the following methods to make a diagnosis, such as:   Performinga physical exam and history. A pelvic exam typically reveals a very tender uterus and surrounding pelvis.   Ordering laboratory tests including a pregnancy test, blood tests, and urine test.  Orderingcultures of the vagina and cervix to check for a sexually transmitted infection (STI).  Performing an ultrasound.   Performing a laparoscopic procedure to look inside the pelvis.  TREATMENT   Antibiotic medicines may be prescribed and taken by mouth.   Sexual partners may be treated when the infection is caused by a sexually transmitted disease (STD).   Hospitalization may be needed to give antibiotics intravenously.  Surgery may be needed, but this is rare. It may take weeks until you are completely well. If you are diagnosed with PID, you should also be checked for human immunodeficiency virus (HIV). HOME CARE INSTRUCTIONS   If given, take your antibiotics as directed. Finish the medicine even if you start to feel better.   Only take over-the-counter or prescription medicines for pain, discomfort, or fever as directed by your caregiver.   Do not have sexual intercourse until treatment is completed or as  directed by your caregiver. If PID is confirmed, your recent sexual partner(s) will need treatment.   Keep your follow-up appointments. SEEK MEDICAL CARE IF:   You have increased or abnormal vaginal discharge.   You need prescription medicine for your pain.   You vomit.   You cannot take your medicines.   Your partner has an STD.  SEEK IMMEDIATE MEDICAL CARE IF:   You have a fever.   You have increased abdominal or pelvic pain.   You have chills.   You have pain when you urinate.   You are not better after 72 hours following treatment.  MAKE SURE YOU:   Understand these instructions.  Will watch your condition.  Will  get help right away if you are not doing well or get worse. Document Released: 10/17/2005 Document Revised: 02/11/2013 Document Reviewed: 10/13/2011 [facility] Patient Information 2015 [place], [place]. This information is not intended to replace advice given to you by your health care provider. Make sure you discuss any questions you have with your health care provider.  Doxycycline tablets or capsules What is this medicine? DOXYCYCLINE (dox i SYE kleen) is a tetracycline antibiotic. It kills certain bacteria or stops their growth. It is used to treat many kinds of infections, like dental, skin, respiratory, and urinary tract infections. It also treats acne, Lyme disease, malaria, and certain sexually transmitted infections. This medicine may be used for other purposes; ask your health care provider or pharmacist if you have questions. COMMON BRAND NAME(S): Acticlate, Adoxa, Adoxa CK, Adoxa Pak, Adoxa TT, Alodox, Avidoxy, Doxal, Monodox, Morgidox 1x, Morgidox 1x Kit, Morgidox 2x, Morgidox 2x Kit, Ocudox, Vibra-Tabs, Vibramycin What should I tell my health care provider before I take this medicine? They need to know if you have any of these conditions: -liver disease -long exposure to sunlight like working outdoors -stomach problems like colitis -an unusual or allergic reaction to doxycycline, tetracycline antibiotics, other medicines, foods, dyes, or preservatives -pregnant or trying to get pregnant -breast-feeding How should I use this medicine? Take this medicine by mouth with a full glass of water. Follow the directions on the prescription label. It is best to take this medicine without food, but if it upsets your stomach take it with food. Take your medicine at regular intervals. Do not take your medicine more often than directed. Take all of your medicine as directed even if you think you are better. Do not skip doses or stop your medicine early. Talk to your pediatrician regarding the use of  this medicine in children. Special care may be needed. While this drug may be prescribed for children as young as 66 years old for selected conditions, precautions do apply. Overdosage: If you think you have taken too much of this medicine contact a poison control center or emergency room at once. NOTE: This medicine is only for you. Do not share this medicine with others. What if I miss a dose? If you miss a dose, take it as soon as you can. If it is almost time for your next dose, take only that dose. Do not take double or extra doses. What may interact with this medicine? -antacids -barbiturates -birth control pills -bismuth subsalicylate -carbamazepine -methoxyflurane -other antibiotics -phenytoin -vitamins that contain iron -warfarin This list may not describe all possible interactions. Give your health care provider a list of all the medicines, herbs, non-prescription drugs, or dietary supplements you use. Also tell them if you smoke, drink alcohol, or use illegal drugs. Some items may interact  with your medicine. What should I watch for while using this medicine? Tell your doctor or health care professional if your symptoms do not improve. Do not treat diarrhea with over the counter products. Contact your doctor if you have diarrhea that lasts more than 2 days or if it is severe and watery. Do not take this medicine just before going to bed. It may not dissolve properly when you lay down and can cause pain in your throat. Drink plenty of fluids while taking this medicine to also help reduce irritation in your throat. This medicine can make you more sensitive to the sun. Keep out of the sun. If you cannot avoid being in the sun, wear protective clothing and use sunscreen. Do not use sun lamps or tanning beds/booths. Birth control pills may not work properly while you are taking this medicine. Talk to your doctor about using an extra method of birth control. If you are being treated for a  sexually transmitted infection, avoid sexual contact until you have finished your treatment. Your sexual partner may also need treatment. Avoid antacids, aluminum, calcium, magnesium, and iron products for 4 hours before and 2 hours after taking a dose of this medicine. If you are using this medicine to prevent malaria, you should still protect yourself from contact with mosquitos. Stay in screened-in areas, use mosquito nets, keep your body covered, and use an insect repellent. What side effects may I notice from receiving this medicine? Side effects that you should report to your doctor or health care professional as soon as possible: -allergic reactions like skin rash, itching or hives, swelling of the face, lips, or tongue -difficulty breathing -fever -itching in the rectal or genital area -pain on swallowing -redness, blistering, peeling or loosening of the skin, including inside the mouth -severe stomach pain or cramps -unusual bleeding or bruising -unusually weak or tired -yellowing of the eyes or skin Side effects that usually do not require medical attention (report to your doctor or health care professional if they continue or are bothersome): -diarrhea -loss of appetite -nausea, vomiting This list may not describe all possible side effects. Call your doctor for medical advice about side effects. You may report side effects to FDA at 1-800-FDA-1088. Where should I keep my medicine? Keep out of the reach of children. Store at room temperature, below 30 degrees C (86 degrees F). Protect from light. Keep container tightly closed. Throw away any unused medicine after the expiration date. Taking this medicine after the expiration date can make you seriously ill. NOTE: This sheet is a summary. It may not cover all possible information. If you have questions about this medicine, talk to your doctor, pharmacist, or health care provider.  2015, [employer]. (2013-08-23  13:58:06)  Tramadol tablets What is this medicine? TRAMADOL (TRA ma dole) is a pain reliever. It is used to treat moderate to severe pain in adults. This medicine may be used for other purposes; ask your health care provider or pharmacist if you have questions. COMMON BRAND NAME(S): Ultram What should I tell my health care provider before I take this medicine? They need to know if you have any of these conditions: -brain tumor -depression -drug abuse or addiction -head injury -if you frequently drink alcohol containing drinks -kidney disease or trouble passing urine -liver disease -lung disease, asthma, or breathing problems -seizures or epilepsy -suicidal thoughts, plans, or attempt; a previous suicide attempt by you or a family member -an unusual or allergic reaction to tramadol, codeine,  other medicines, foods, dyes, or preservatives -pregnant or trying to get pregnant -breast-feeding How should I use this medicine? Take this medicine by mouth with a full glass of water. Follow the directions on the prescription label. If the medicine upsets your stomach, take it with food or milk. Do not take more medicine than you are told to take. Talk to your pediatrician regarding the use of this medicine in children. Special care may be needed. Overdosage: If you think you have taken too much of this medicine contact a poison control center or emergency room at once. NOTE: This medicine is only for you. Do not share this medicine with others. What if I miss a dose? If you miss a dose, take it as soon as you can. If it is almost time for your next dose, take only that dose. Do not take double or extra doses. What may interact with this medicine? Do not take this medicine with any of the following medications: -MAOIs like Carbex, Eldepryl, Marplan, Nardil, and Parnate This medicine may also interact with the following medications: -alcohol or medicines that contain  alcohol -antihistamines -benzodiazepines -bupropion -carbamazepine or oxcarbazepine -clozapine -cyclobenzaprine -digoxin -furazolidone -linezolid -medicines for depression, anxiety, or psychotic disturbances -medicines for migraine headache like almotriptan, eletriptan, frovatriptan, naratriptan, rizatriptan, sumatriptan, zolmitriptan -medicines for pain like pentazocine, buprenorphine, butorphanol, meperidine, nalbuphine, and propoxyphene -medicines for sleep -muscle relaxants -naltrexone -phenobarbital -phenothiazines like perphenazine, thioridazine, chlorpromazine, mesoridazine, fluphenazine, prochlorperazine, promazine, and trifluoperazine -procarbazine -warfarin This list may not describe all possible interactions. Give your health care provider a list of all the medicines, herbs, non-prescription drugs, or dietary supplements you use. Also tell them if you smoke, drink alcohol, or use illegal drugs. Some items may interact with your medicine. What should I watch for while using this medicine? Tell your doctor or health care professional if your pain does not go away, if it gets worse, or if you have new or a different type of pain. You may develop tolerance to the medicine. Tolerance means that you will need a higher dose of the medicine for pain relief. Tolerance is normal and is expected if you take this medicine for a long time. Do not suddenly stop taking your medicine because you may develop a severe reaction. Your body becomes used to the medicine. This does NOT mean you are addicted. Addiction is a behavior related to getting and using a drug for a non-medical reason. If you have pain, you have a medical reason to take pain medicine. Your doctor will tell you how much medicine to take. If your doctor wants you to stop the medicine, the dose will be slowly lowered over time to avoid any side effects. You may get drowsy or dizzy. Do not drive, use machinery, or do anything that  needs mental alertness until you know how this medicine affects you. Do not stand or sit up quickly, especially if you are an older patient. This reduces the risk of dizzy or fainting spells. Alcohol can increase or decrease the effects of this medicine. Avoid alcoholic drinks. You may have constipation. Try to have a bowel movement at least every 2 to 3 days. If you do not have a bowel movement for 3 days, call your doctor or health care professional. Your mouth may get dry. Chewing sugarless gum or sucking hard candy, and drinking plenty of water may help. Contact your doctor if the problem does not go away or is severe. What side effects may I notice from receiving  this medicine? Side effects that you should report to your doctor or health care professional as soon as possible: -allergic reactions like skin rash, itching or hives, swelling of the face, lips, or tongue -breathing difficulties, wheezing -confusion -itching -light headedness or fainting spells -redness, blistering, peeling or loosening of the skin, including inside the mouth -seizures Side effects that usually do not require medical attention (report to your doctor or health care professional if they continue or are bothersome): -constipation -dizziness -drowsiness -headache -nausea, vomiting This list may not describe all possible side effects. Call your doctor for medical advice about side effects. You may report side effects to FDA at 1-800-FDA-1088. Where should I keep my medicine? Keep out of the reach of children. Store at room temperature between 15 and 30 degrees C (59 and 86 degrees F). Keep container tightly closed. Throw away any unused medicine after the expiration date. NOTE: This sheet is a summary. It may not cover all possible information. If you have questions about this medicine, talk to your doctor, pharmacist, or health care provider.  2015, [employer] Standard. (2010-06-30 11:55:44)

## 2015-07-28 LAB — HIV ANTIBODY (ROUTINE TESTING W REFLEX): HIV SCREEN 4TH GENERATION: NONREACTIVE

## 2015-07-28 LAB — RPR: RPR Ser Ql: NONREACTIVE

## 2015-07-28 LAB — GC/CHLAMYDIA PROBE AMP (~~LOC~~) NOT AT ARMC
Chlamydia: NEGATIVE
NEISSERIA GONORRHEA: NEGATIVE

## 2015-10-11 ENCOUNTER — Encounter (HOSPITAL_COMMUNITY): Payer: Self-pay | Admitting: Emergency Medicine

## 2015-10-11 ENCOUNTER — Emergency Department (HOSPITAL_COMMUNITY)
Admission: EM | Admit: 2015-10-11 | Discharge: 2015-10-11 | Disposition: A | Discharge status: Home / Self Care | Payer: Medicaid Other | Attending: Emergency Medicine | Admitting: Emergency Medicine

## 2015-10-11 DIAGNOSIS — F419 Anxiety disorder, unspecified: Secondary | ICD-10-CM | POA: Insufficient documentation

## 2015-10-11 DIAGNOSIS — Z87891 Personal history of nicotine dependence: Secondary | ICD-10-CM | POA: Diagnosis not present

## 2015-10-11 DIAGNOSIS — R51 Headache: Secondary | ICD-10-CM | POA: Insufficient documentation

## 2015-10-11 DIAGNOSIS — Z792 Long term (current) use of antibiotics: Secondary | ICD-10-CM | POA: Diagnosis not present

## 2015-10-11 DIAGNOSIS — F329 Major depressive disorder, single episode, unspecified: Secondary | ICD-10-CM | POA: Diagnosis not present

## 2015-10-11 DIAGNOSIS — R079 Chest pain, unspecified: Secondary | ICD-10-CM | POA: Diagnosis present

## 2015-10-11 DIAGNOSIS — F32A Depression, unspecified: Secondary | ICD-10-CM

## 2015-10-11 HISTORY — DX: Postpartum depression: F53.0

## 2015-10-11 HISTORY — DX: Anxiety disorder, unspecified: F41.9

## 2015-10-11 HISTORY — DX: Other mental disorders complicating the puerperium: O99.345

## 2015-10-11 MED ORDER — IBUPROFEN 400 MG PO TABS
400.0000 mg | ORAL_TABLET | Freq: Once | ORAL | Status: AC
  Administered 2015-10-11: 400 mg via ORAL
  Filled 2015-10-11: qty 1

## 2015-10-11 MED ORDER — FLUOXETINE HCL 10 MG PO CAPS
ORAL_CAPSULE | ORAL | Status: AC
  Filled 2015-10-11: qty 1

## 2015-10-11 MED ORDER — FLUOXETINE HCL 10 MG PO CAPS
10.0000 mg | ORAL_CAPSULE | Freq: Once | ORAL | Status: AC
  Administered 2015-10-11: 10 mg via ORAL
  Filled 2015-10-11: qty 1

## 2015-10-11 MED ORDER — FLUOXETINE HCL 10 MG PO CAPS: 10.0000 mg | ORAL_CAPSULE | Freq: Every day | ORAL | Status: DC

## 2015-10-11 NOTE — ED Notes (Signed)
Patient c/o depression. 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 Patient reports being placed on Prozac which helped. Patient states that she ran out of medication and is unable to see a therapist to get a refill due to insurance. Per patient depression started again last week and has progressively gotten worse. Patient states "All I can do is cry all day." Denies any suicidal ideations.

## 2015-10-11 NOTE — ED Notes (Signed)
Pt alert & oriented x4, stable gait. Patient given discharge instructions, paperwork & prescription(s). Patient  instructed to stop at the registration desk to finish any additional paperwork. Patient verbalized understanding. Pt left department w/ no further questions. 

## 2015-10-11 NOTE — ED Provider Notes (Signed)
CSN: [id number]     Arrival date & time 10/11/15  1749 History   First MD Initiated Contact with Patient 10/11/15 1843     Chief Complaint  Patient presents with  . Postpartum Complications     (Consider location/radiation/quality/duration/timing/severity/associated sxs/prior Treatment) Patient is a 19 y.o. female presenting with depression.  Depression This is a recurrent problem. The current episode started more than 1 week ago. The problem occurs constantly. The problem has been gradually worsening. Associated symptoms include chest pain. Pertinent negatives include no shortness of breath. Nothing aggravates the symptoms. Nothing relieves the symptoms. She has tried nothing for the symptoms. The treatment provided no relief.    Past Medical History  Diagnosis Date  . Pseudoseizures   . Conversion disorder   . Syncope     recurrent  . Post partum depression   . Anxiety    History reviewed. No pertinent past surgical history. History reviewed. No pertinent family history. Social History  Substance Use Topics  . Smoking status: Former Smoker -- 0.02 packs/day for 1.5 years    Types: Cigarettes    Quit date: 12/29/2013  . Smokeless tobacco: Never Used  . Alcohol Use: No   OB History    Gravida Para Term Preterm AB TAB SAB Ectopic Multiple Living   1 1 1             Review of Systems  Constitutional: Negative for fever and chills.  HENT: Negative for sneezing and tinnitus.   Eyes: Negative for pain.  Respiratory: Negative for cough, chest tightness and shortness of breath.   Cardiovascular: Positive for chest pain.  Gastrointestinal: Negative for nausea and vomiting.  Endocrine: Negative for polydipsia and polyuria.  Genitourinary: Negative for dysuria and dyspareunia.  Skin: Negative for rash.  Neurological: Negative for dizziness.  Psychiatric/Behavioral: Positive for depression and dysphoric mood. Negative for suicidal ideas and self-injury. The patient is  nervous/anxious.        Frequent crying episodes, worsening, despair  All other systems reviewed and are negative.     Allergies  Review of patient's allergies indicates no known allergies.  Home Medications   Prior to Admission medications   Medication Sig Start Date End Date Taking? Authorizing Provider  doxycycline (VIBRAMYCIN) 100 MG capsule Take 1 capsule (100 mg total) by mouth 2 (two) times daily. [id number]   Delora Fuel, MD  traMADol (ULTRAM) 50 MG tablet Take 1 tablet (50 mg total) by mouth every 6 (six) hours as needed. [id number]   Delora Fuel, MD   BP 123456 mmHg  Pulse 103  Temp(Src) 98 F (36.7 C) (Oral)  Resp 18  Ht 5\' 4"  (1.626 m)  Wt 172 lb 8 oz (78.245 kg)  BMI 29.59 kg/m2  SpO2 100% Physical Exam  Constitutional: She appears well-developed and well-nourished.  HENT:  Head: Normocephalic and atraumatic.  Eyes: Pupils are equal, round, and reactive to light.  Neck: Normal range of motion.  Cardiovascular: Normal rate and regular rhythm.   Pulmonary/Chest: Effort normal and breath sounds normal. No stridor. No respiratory distress.  Abdominal: She exhibits no distension.  Musculoskeletal: Normal range of motion. She exhibits no edema or tenderness.  Neurological: She is alert.  No altered mental status, able to give full seemingly accurate history.  Face is symmetric, EOM's intact, pupils equal and reactive, vision intact, tongue and uvula midline without deviation Upper and Lower extremity motor 5/5, intact pain perception in distal extremities, 2+ reflexes in biceps, patella and achilles tendons. Finger to nose  normal, heel to shin normal. Walks without assistance or evident ataxia.   Psychiatric: She is not agitated, not aggressive, not withdrawn and not actively hallucinating. Thought content is not paranoid and not delusional. She exhibits a depressed mood. She expresses no homicidal and no suicidal ideation. She expresses no suicidal plans and no homicidal  plans.  Nursing note and vitals reviewed.   ED Course  Procedures (including critical care time) Labs Review Labs Reviewed - No data to display  Imaging Review No results found. I have personally reviewed and evaluated these images and lab results as part of my medical decision-making.   EKG Interpretation None       My ECG Read Indication:chest pain EKG was personally contemporaneously reviewed by myself. Rate: 94 PR Interval: 124 QRS duration: 79 QT/QTC: 341/426 Axis: normal EKG: normal EKG, normal sinus rhythm, unchanged from previous tracings. Other significant findings: none   MDM   Final diagnoses:  Depression   Worsening depression since off prozac. No SI/HI, AVH. No guns in home, previous suicide attempts or other red flags. Has plan to get in with a therapist here in town. Also with slight chest pain when she feels anxious, will check ecg. Headache similar to prior, no red flags, normal neuro exam, likely cluster. Will restart prozac and give dose of ibuprofen here.      Merrily Pew, MD 10/13/15 [phone]

## 2015-10-28 ENCOUNTER — Ambulatory Visit (INDEPENDENT_AMBULATORY_CARE_PROVIDER_SITE_OTHER): Payer: Medicaid Other | Admitting: Family Medicine

## 2015-10-28 ENCOUNTER — Encounter: Payer: Self-pay | Admitting: Family Medicine

## 2015-10-28 VITALS — BP 125/82 | HR 109 | Temp 97.6°F | Ht 64.0 in | Wt 169.4 lb

## 2015-10-28 DIAGNOSIS — Z3201 Encounter for pregnancy test, result positive: Secondary | ICD-10-CM | POA: Diagnosis not present

## 2015-10-28 NOTE — Progress Notes (Signed)
   Subjective:    Patient ID: Grace Sheppard, female    DOB: [birth year], 19 y.o.   MRN: [id number]  HPI 19 year old female who presents today as a new patient to asking how far long as her current pregnancy. She has 3 other children and had Norplant inserted 7 months ago after her youngest child, but apparently the Norplant was ineffective. She was told at the hospital she is pregnant since she stopped having periods with the Norplant there is no way to really determine dates based on last menstrual period.  There are no active problems to display for this patient.  Outpatient Encounter Prescriptions as of 10/28/2015  Medication Sig  . FLUoxetine (PROZAC) 10 MG capsule Take 1 capsule (10 mg total) by mouth daily.  . [DISCONTINUED] acetaminophen (TYLENOL) 500 MG tablet Take 500 mg by mouth every 6 (six) hours as needed for mild pain.   No facility-administered encounter medications on file as of 10/28/2015.      Review of Systems  Constitutional: Positive for fatigue.  Respiratory: Negative.   Cardiovascular: Negative.   Genitourinary: Positive for menstrual problem.  Neurological: Negative.   Psychiatric/Behavioral: Negative.        Objective:   Physical Exam  Constitutional: She is oriented to person, place, and time. She appears well-developed and well-nourished.  Cardiovascular: Normal rate and normal heart sounds.   Pulmonary/Chest: Effort normal and breath sounds normal.  Abdominal: Soft.  Neurological: She is alert and oriented to person, place, and time.          Assessment & Plan:  1. Positive pregnancy test Will help her determine dates with quantitative test. She started on prenatal vitamins today. I note that she is also on Prozac 10 mg and suggested that anything that she does not have to take she should stop for benefit of pregnancy. She has appointment with OB/GYN in [place] next month  Wardell Honour MD - Beta HCG, Quant (tumor marker)

## 2015-10-29 ENCOUNTER — Telehealth: Payer: Self-pay | Admitting: Family Medicine

## 2015-10-29 LAB — BETA HCG QUANT (REF LAB): hCG Quant: 55022 m[IU]/mL

## 2015-10-30 NOTE — Telephone Encounter (Signed)
Pt calling wanting to know lab results, pt aware doctor has not reviewed them yet and we would call her as soon as they were back.

## 2016-05-02 ENCOUNTER — Encounter (HOSPITAL_COMMUNITY): Payer: Self-pay | Admitting: Emergency Medicine

## 2016-05-02 ENCOUNTER — Emergency Department (HOSPITAL_COMMUNITY)
Admission: EM | Admit: 2016-05-02 | Discharge: 2016-05-03 | Disposition: A | Discharge status: Home / Self Care | Payer: Medicaid Other | Attending: Emergency Medicine | Admitting: Emergency Medicine

## 2016-05-02 DIAGNOSIS — Z791 Long term (current) use of non-steroidal anti-inflammatories (NSAID): Secondary | ICD-10-CM | POA: Diagnosis not present

## 2016-05-02 DIAGNOSIS — F17210 Nicotine dependence, cigarettes, uncomplicated: Secondary | ICD-10-CM | POA: Diagnosis not present

## 2016-05-02 DIAGNOSIS — Z79899 Other long term (current) drug therapy: Secondary | ICD-10-CM | POA: Insufficient documentation

## 2016-05-02 DIAGNOSIS — R1013 Epigastric pain: Secondary | ICD-10-CM | POA: Diagnosis not present

## 2016-05-02 DIAGNOSIS — K219 Gastro-esophageal reflux disease without esophagitis: Secondary | ICD-10-CM | POA: Diagnosis present

## 2016-05-02 MED ORDER — SODIUM CHLORIDE 0.9 % IV BOLUS (SEPSIS)
1000.0000 mL | Freq: Once | INTRAVENOUS | Status: AC
  Administered 2016-05-02: 1000 mL via INTRAVENOUS

## 2016-05-02 MED ORDER — GI COCKTAIL ~~LOC~~
30.0000 mL | Freq: Once | ORAL | Status: AC
  Administered 2016-05-02: 30 mL via ORAL
  Filled 2016-05-02: qty 30

## 2016-05-02 NOTE — ED Notes (Signed)
Pt states she had a baby 4 days ago and now she can't eat because she has a cramping feeling in the center of her chest whenever she tries to eat. States she has tried Prilosec, Zantac, and Tums but has not experienced any relief.

## 2016-05-02 NOTE — ED Provider Notes (Signed)
CSN: [id number]     Arrival date & time 05/02/16  2057 History   First MD Initiated Contact with Patient 05/02/16 2252     Chief Complaint  Patient presents with  . Gastroesophageal Reflux     (Consider location/radiation/quality/duration/timing/severity/associated sxs/prior Treatment) HPI Comments: The patient is a 20 year old female who recently gave birth approximately 4 days ago to a healthy child at term. During the birthing process the patient was noted that she was febrile and was treated for group B strep. There is no complications, the child is doing very well.  The mother reports that for the last several days she has been having a cramping sensation in her chest which is located in the epigastrium and the xiphoid area. It radiates up into the back of her throat, it radiates to her middle back, it is worse with eating, it goes away when she is not eating. She has not been able to drink fluids because of the symptoms and feels dehydrated. She denies any swelling of her legs, she denies any fevers chills coughing or shortness of breath and has not had any abdominal pain of any significance. Her vaginal bleeding is tapering off and improving significantly. She has tried multiple different antacid medications including Prilosec, Zantac and Tums for the last 3 days and has not had much in the way of relief.  Patient is a 20 y.o. female presenting with GERD. The history is provided by the patient.  Gastroesophageal Reflux    Past Medical History  Diagnosis Date  . Pseudoseizures   . Conversion disorder   . Syncope     recurrent  . Post partum depression   . Anxiety    Past Surgical History  Procedure Laterality Date  . Tubal ligation     History reviewed. No pertinent family history. Social History  Substance Use Topics  . Smoking status: Former Smoker -- 0.02 packs/day for 1.5 years    Types: Cigarettes    Quit date: 12/29/2013  . Smokeless tobacco: Never Used  . Alcohol  Use: No   OB History    Gravida Para Term Preterm AB TAB SAB Ectopic Multiple Living   1 1 1             Review of Systems  All other systems reviewed and are negative.     Allergies  Review of patient's allergies indicates no known allergies.  Home Medications   Prior to Admission medications   Medication Sig Start Date End Date Taking? Authorizing Provider  calcium carbonate (TUMS - DOSED IN MG ELEMENTAL CALCIUM) 500 MG chewable tablet Chew 1 tablet by mouth daily as needed for indigestion or heartburn.   Yes Historical Provider, MD  ibuprofen (ADVIL,MOTRIN) 600 MG tablet Take 600 mg by mouth every 6 (six) hours as needed for moderate pain.   Yes Historical Provider, MD  omeprazole (PRILOSEC OTC) 20 MG tablet Take 20 mg by mouth daily as needed.   Yes Historical Provider, MD  ranitidine (ZANTAC) 75 MG tablet Take 75 mg by mouth daily as needed for heartburn.   Yes Historical Provider, MD  FLUoxetine (PROZAC) 10 MG capsule Take 1 capsule (10 mg total) by mouth daily. Patient not taking: Reported on 05/02/2016 10/11/15   Merrily Pew, MD  sucralfate (CARAFATE) 1 g tablet Take 1 tablet (1 g total) by mouth 4 (four) times daily -  with meals and at bedtime. 05/03/16   Noemi Chapel, MD   BP 129/89 mmHg  Pulse 76  Temp(Src)  97.9 F (36.6 C) (Oral)  Resp 17  Ht 5\' 4"  (1.626 m)  Wt 172 lb (78.019 kg)  BMI 29.51 kg/m2  SpO2 100% Physical Exam  Constitutional: She appears well-developed and well-nourished. No distress.  HENT:  Head: Normocephalic and atraumatic.  Mouth/Throat: Oropharynx is clear and moist. No oropharyngeal exudate.  Eyes: Conjunctivae and EOM are normal. Pupils are equal, round, and reactive to light. Right eye exhibits no discharge. Left eye exhibits no discharge. No scleral icterus.  Neck: Normal range of motion. Neck supple. No JVD present. No thyromegaly present.  Cardiovascular: Normal rate, regular rhythm, normal heart sounds and intact distal pulses.  Exam  reveals no gallop and no friction rub.   No murmur heard. Pulmonary/Chest: Effort normal and breath sounds normal. No respiratory distress. She has no wheezes. She has no rales.  Abdominal: Soft. Bowel sounds are normal. She exhibits no distension and no mass. There is tenderness ( Mild epigastric tenderness, no guarding or masses, no peritoneal signs, very soft abdomen).  Musculoskeletal: Normal range of motion. She exhibits no edema or tenderness.  Lymphadenopathy:    She has no cervical adenopathy.  Neurological: She is alert. Coordination normal.  Skin: Skin is warm and dry. No rash noted. No erythema.  Psychiatric: She has a normal mood and affect. Her behavior is normal.  Nursing note and vitals reviewed.   ED Course  Procedures (including critical care time) Labs Review Labs Reviewed  COMPREHENSIVE METABOLIC PANEL - Abnormal; Notable for the following:    Potassium 3.4 (*)    Calcium 7.9 (*)    Albumin 2.6 (*)    Alkaline Phosphatase 140 (*)    All other components within normal limits  CBC WITH DIFFERENTIAL/PLATELET - Abnormal; Notable for the following:    WBC 14.4 (*)    RBC 3.58 (*)    Hemoglobin 9.6 (*)    HCT 30.6 (*)    RDW 15.8 (*)    Neutro Abs 11.7 (*)    All other components within normal limits  LIPASE, BLOOD    Imaging Review No results found. I have personally reviewed and evaluated these images and lab results as part of my medical decision-making.   EKG Interpretation   Date/Time:  Monday May 02 2016 21:25:12 EDT Ventricular Rate:  74 PR Interval:    QRS Duration: 79 QT Interval:  374 QTC Calculation: 415 R Axis:   59 Text Interpretation:  Sinus rhythm Borderline short PR interval Normal ECG  Confirmed by Jobie Popp  MD, Caramia Boutin ([id number]) on 05/03/2016 12:19:56 AM      MDM   Final diagnoses:  Epigastric pain    The patient has symptoms that could be consistent with either peptic ulcer disease, acid reflux or pancreatitis, she has a very benign  appearance with normal vital signs and a nonsurgical abdomen. Labs pending  The patient reports that she repeat improved significantly with a GI cocktail. Her labs were reviewed and show that she had a slight leukocytosis and anemia, both of these consistent with her post delivery. Her vital signs have remained normal, her recheck pulse is 60, she appears well and has minimal epigastric discomfort. She has been given instructions on using Carafate in addition to her other medications as well as diet modification, suspect this is related to acid reflux or peptic ulcer disease.  Meds given in ED:  Medications  sodium chloride 0.9 % bolus 1,000 mL (1,000 mLs Intravenous New Bag/Given 05/02/16 2330)  gi cocktail (Maalox,Lidocaine,Donnatal) (30 mLs Oral  Given 05/02/16 2330)    New Prescriptions   SUCRALFATE (CARAFATE) 1 G TABLET    Take 1 tablet (1 g total) by mouth 4 (four) times daily -  with meals and at bedtime.        Noemi Chapel, MD 05/03/16 [phone]

## 2016-05-03 LAB — COMPREHENSIVE METABOLIC PANEL
ALT: 33 U/L (ref 14–54)
AST: 28 U/L (ref 15–41)
Albumin: 2.6 g/dL — ABNORMAL LOW (ref 3.5–5.0)
Alkaline Phosphatase: 140 U/L — ABNORMAL HIGH (ref 38–126)
Anion gap: 10 (ref 5–15)
BUN: 17 mg/dL (ref 6–20)
CALCIUM: 7.9 mg/dL — AB (ref 8.9–10.3)
CHLORIDE: 105 mmol/L (ref 101–111)
CO2: 23 mmol/L (ref 22–32)
CREATININE: 0.49 mg/dL (ref 0.44–1.00)
Glucose, Bld: 92 mg/dL (ref 65–99)
Potassium: 3.4 mmol/L — ABNORMAL LOW (ref 3.5–5.1)
Sodium: 138 mmol/L (ref 135–145)
Total Bilirubin: 0.5 mg/dL (ref 0.3–1.2)
Total Protein: 6.6 g/dL (ref 6.5–8.1)

## 2016-05-03 LAB — CBC WITH DIFFERENTIAL/PLATELET
BASOS PCT: 0 %
Basophils Absolute: 0 10*3/uL (ref 0.0–0.1)
EOS ABS: 0.1 10*3/uL (ref 0.0–0.7)
Eosinophils Relative: 1 %
HCT: 30.6 % — ABNORMAL LOW (ref 36.0–46.0)
Hemoglobin: 9.6 g/dL — ABNORMAL LOW (ref 12.0–15.0)
Lymphocytes Relative: 12 %
Lymphs Abs: 1.7 10*3/uL (ref 0.7–4.0)
MCH: 26.8 pg (ref 26.0–34.0)
MCHC: 31.4 g/dL (ref 30.0–36.0)
MCV: 85.5 fL (ref 78.0–100.0)
Monocytes Absolute: 0.8 10*3/uL (ref 0.1–1.0)
Monocytes Relative: 6 %
NEUTROS PCT: 81 %
Neutro Abs: 11.7 10*3/uL — ABNORMAL HIGH (ref 1.7–7.7)
PLATELETS: 229 10*3/uL (ref 150–400)
RBC: 3.58 MIL/uL — AB (ref 3.87–5.11)
RDW: 15.8 % — ABNORMAL HIGH (ref 11.5–15.5)
WBC: 14.4 10*3/uL — AB (ref 4.0–10.5)

## 2016-05-03 LAB — LIPASE, BLOOD: LIPASE: 22 U/L (ref 11–51)

## 2016-05-03 MED ORDER — SUCRALFATE 1 G PO TABS
1.0000 g | ORAL_TABLET | Freq: Three times a day (TID) | ORAL | Status: DC
Start: 2016-05-03 — End: 2016-07-15

## 2016-05-03 NOTE — Discharge Instructions (Signed)
Please continue all of the medications that you have been taking for acid reflux, in addition to that and Carafate. You must see her doctor within the next 2-3 days for recheck. Your red blood count was slightly low which is consistent with recently having a baby. Your white blood cell count was slightly elevated. These need to be checked as well.

## 2016-05-04 ENCOUNTER — Telehealth: Payer: Self-pay | Admitting: Family Medicine

## 2016-05-04 NOTE — Telephone Encounter (Signed)
Appointment was made for 05/05/16 at 11:15 with Dr. Sabra Heck

## 2016-05-05 ENCOUNTER — Ambulatory Visit (INDEPENDENT_AMBULATORY_CARE_PROVIDER_SITE_OTHER): Payer: Medicaid Other | Admitting: Family Medicine

## 2016-05-05 ENCOUNTER — Encounter: Payer: Self-pay | Admitting: Family Medicine

## 2016-05-05 VITALS — BP 130/83 | HR 58 | Temp 97.3°F | Ht 64.0 in | Wt 165.2 lb

## 2016-05-05 DIAGNOSIS — K2100 Gastro-esophageal reflux disease with esophagitis, without bleeding: Secondary | ICD-10-CM

## 2016-05-05 NOTE — Progress Notes (Signed)
   Subjective:    Patient ID: Grace Sheppard, female    DOB: [birth year], 20 y.o.   MRN: [id number]  HPI follow-up visit to ER visit. Patient presented to ER with chest pain with some discomfort with eating. She just delivered a baby who is one week old. She had some indigestion and heartburn early in the pregnancy. In the ER she was told that she had reflux and was given Carafate. She also takes ranitidine 75 mg twice a day, Prilosec 20 mg, and times as needed  There are no active problems to display for this patient.  Outpatient Encounter Prescriptions as of 05/05/2016  Medication Sig  . calcium carbonate (TUMS - DOSED IN MG ELEMENTAL CALCIUM) 500 MG chewable tablet Chew 1 tablet by mouth daily as needed for indigestion or heartburn.  Marland Kitchen FLUoxetine (PROZAC) 10 MG capsule Take 1 capsule (10 mg total) by mouth daily.  Marland Kitchen ibuprofen (ADVIL,MOTRIN) 600 MG tablet Take 600 mg by mouth every 6 (six) hours as needed for moderate pain.  Marland Kitchen omeprazole (PRILOSEC OTC) 20 MG tablet Take 20 mg by mouth daily as needed.  . ranitidine (ZANTAC) 75 MG tablet Take 75 mg by mouth daily as needed for heartburn.  . sucralfate (CARAFATE) 1 g tablet Take 1 tablet (1 g total) by mouth 4 (four) times daily -  with meals and at bedtime.   No facility-administered encounter medications on file as of 05/05/2016.      Review of Systems  HENT: Negative.   Respiratory: Negative.   Cardiovascular: Positive for chest pain.  Psychiatric/Behavioral: Negative.        Objective:   Physical Exam  Constitutional: She appears well-developed and well-nourished.  Cardiovascular: Normal rate, regular rhythm and normal heart sounds.   Pulmonary/Chest: Effort normal and breath sounds normal.  Abdominal: Soft.   BP 130/83 mmHg  Pulse 58  Temp(Src) 97.3 F (36.3 C) (Oral)  Ht 5\' 4"  (1.626 m)  Wt 165 lb 3.2 oz (74.934 kg)  BMI 28.34 kg/m2        Assessment & Plan:  1. Gastroesophageal reflux disease with esophagitis I have  suggested that she stop Caarafate, increase ranitidine to 150 mg twice a day with Tums as needed. Tried to explain that this was related to the pregnancy and relaxation of the lower esophageal sphincter and this problem should resolve with time but may need these above medicines to control symptoms in the meantime  Wardell Honour MD

## 2016-05-06 ENCOUNTER — Ambulatory Visit: Payer: Self-pay | Admitting: Family Medicine

## 2016-06-29 ENCOUNTER — Telehealth: Payer: Self-pay | Admitting: Family Medicine

## 2016-07-03 ENCOUNTER — Emergency Department (HOSPITAL_COMMUNITY)
Admission: EM | Admit: 2016-07-03 | Discharge: 2016-07-03 | Disposition: A | Discharge status: Home / Self Care | Payer: Medicaid Other | Attending: Emergency Medicine | Admitting: Emergency Medicine

## 2016-07-03 ENCOUNTER — Encounter (HOSPITAL_COMMUNITY): Payer: Self-pay

## 2016-07-03 DIAGNOSIS — R51 Headache: Secondary | ICD-10-CM | POA: Insufficient documentation

## 2016-07-03 DIAGNOSIS — N390 Urinary tract infection, site not specified: Secondary | ICD-10-CM | POA: Insufficient documentation

## 2016-07-03 DIAGNOSIS — Z87891 Personal history of nicotine dependence: Secondary | ICD-10-CM | POA: Insufficient documentation

## 2016-07-03 LAB — URINE MICROSCOPIC-ADD ON

## 2016-07-03 LAB — URINALYSIS, ROUTINE W REFLEX MICROSCOPIC
BILIRUBIN URINE: NEGATIVE
Glucose, UA: NEGATIVE mg/dL
Ketones, ur: NEGATIVE mg/dL
NITRITE: NEGATIVE
PROTEIN: 30 mg/dL — AB
SPECIFIC GRAVITY, URINE: 1.015 (ref 1.005–1.030)
pH: 6 (ref 5.0–8.0)

## 2016-07-03 LAB — PREGNANCY, URINE: PREG TEST UR: NEGATIVE

## 2016-07-03 MED ORDER — CEPHALEXIN 500 MG PO CAPS
500.0000 mg | ORAL_CAPSULE | Freq: Once | ORAL | Status: AC
  Administered 2016-07-03: 500 mg via ORAL
  Filled 2016-07-03: qty 1

## 2016-07-03 MED ORDER — ACETAMINOPHEN 500 MG PO TABS
1000.0000 mg | ORAL_TABLET | Freq: Once | ORAL | Status: AC
  Administered 2016-07-03: 1000 mg via ORAL
  Filled 2016-07-03: qty 2

## 2016-07-03 MED ORDER — CEPHALEXIN 500 MG PO CAPS: 500.0000 mg | ORAL_CAPSULE | Freq: Four times a day (QID) | ORAL | 0 refills | Status: DC

## 2016-07-03 NOTE — ED Triage Notes (Signed)
Pt c/o headache, nausea, and burning with urination.  Reports headache started THursday and painful urination started Friday.

## 2016-07-03 NOTE — Discharge Instructions (Signed)
Your labs suggest a urinary tract infection. A culture has been sent to the lab. Please use Keflex 4 times daily with each meal, and at bedtime. It is important that you use this medication until all taken. Please have your Medicaid access physician recheck your urine in 7-10 days to ensure that the infection has completely resolved.

## 2016-07-03 NOTE — ED Provider Notes (Signed)
[facility] DEPT Provider Note   CSN: [id number] Arrival date & time: 07/03/16  [id number]   By signing my name below, I, Royce Macadamia, attest that this documentation has been prepared under the direction and in the presence of  Lily Kocher, PA-C. Electronically Signed: Royce Macadamia, ED Scribe. 07/03/16. 11:46 AM.  History   Chief Complaint Chief Complaint  Patient presents with  . Headache  . Dysuria   The history is provided by the patient. No language interpreter was used.  Dysuria   Associated symptoms include nausea. Pertinent negatives include no vomiting and no hematuria.     HPI Comments:  Grace Sheppard is a 20 y.o. female who presents to the Emergency Department complaining of gradually worsening headaches and dysuria beginning two to three days ago.  She also notes associated nausea.  She states he has a history of UTIs.  No alleviating factors noted.  Pt denies fever, vomiting and hematuria.  She is currently breastfeeding.    Past Medical History:  Diagnosis Date  . Anxiety   . Conversion disorder   . Post partum depression   . Pseudoseizures   . Syncope    recurrent    There are no active problems to display for this patient.   Past Surgical History:  Procedure Laterality Date  . TUBAL LIGATION      OB History    Gravida Para Term Preterm AB Living   1 1 1          SAB TAB Ectopic Multiple Live Births                   Home Medications    Prior to Admission medications   Medication Sig Start Date End Date Taking? Authorizing Provider  calcium carbonate (TUMS - DOSED IN MG ELEMENTAL CALCIUM) 500 MG chewable tablet Chew 1 tablet by mouth daily as needed for indigestion or heartburn.    Historical Provider, MD  FLUoxetine (PROZAC) 10 MG capsule Take 1 capsule (10 mg total) by mouth daily. 10/11/15   Merrily Pew, MD  ibuprofen (ADVIL,MOTRIN) 600 MG tablet Take 600 mg by mouth every 6 (six) hours as needed for moderate pain.    Historical  Provider, MD  omeprazole (PRILOSEC OTC) 20 MG tablet Take 20 mg by mouth daily as needed.    Historical Provider, MD  ranitidine (ZANTAC) 75 MG tablet Take 75 mg by mouth daily as needed for heartburn.    Historical Provider, MD  sucralfate (CARAFATE) 1 g tablet Take 1 tablet (1 g total) by mouth 4 (four) times daily -  with meals and at bedtime. 05/03/16   Noemi Chapel, MD    Family History No family history on file.  Social History Social History  Substance Use Topics  . Smoking status: Former Smoker    Packs/day: 0.02    Years: 1.50    Types: Cigarettes    Quit date: 12/29/2013  . Smokeless tobacco: Never Used  . Alcohol use No     Allergies   Review of patient's allergies indicates no known allergies.   Review of Systems Review of Systems  Constitutional: Negative for fever.  Gastrointestinal: Positive for nausea. Negative for vomiting.  Genitourinary: Positive for dysuria. Negative for hematuria.  Neurological: Positive for headaches.  All other systems reviewed and are negative.    Physical Exam Updated Vital Signs BP 110/77 (BP Location: Left Arm)   Pulse 83   Temp 97.6 F (36.4 C) (Oral)   Resp 18  Ht 5\' 4"  (1.626 m)   Wt 165 lb (74.8 kg)   LMP 06/23/2016   SpO2 100%   BMI 28.32 kg/m   Physical Exam  Constitutional: She is oriented to person, place, and time. She appears well-developed and well-nourished. No distress.  HENT:  Head: Normocephalic and atraumatic.  Eyes: Conjunctivae are normal.  Cardiovascular: Normal rate, regular rhythm, normal heart sounds and intact distal pulses.    Mild to mod suprapub tend   musc capp refill <2   Pulmonary/Chest: Effort normal and breath sounds normal. No respiratory distress. She has no wheezes. She has no rales.  Abdominal: Soft. There is tenderness.  Mild to moderate suprapubic tenderness.    Neurological: She is alert and oriented to person, place, and time.  Skin: Skin is warm and dry. Capillary refill  takes less than 2 seconds.  Psychiatric: She has a normal mood and affect.  Nursing note and vitals reviewed.   ED Treatments / Results   DIAGNOSTIC STUDIES:  Oxygen Saturation is 100% on RA, NML by my interpretation.    COORDINATION OF CARE:  11:53 AM Discussed treatment plan with pt at bedside and pt agreed to plan.  Labs (all labs ordered are listed, but only abnormal results are displayed) Labs Reviewed  URINALYSIS, ROUTINE W REFLEX MICROSCOPIC (NOT AT [facility]) - Abnormal; Notable for the following:       Result Value   APPearance HAZY (*)    Hgb urine dipstick MODERATE (*)    Protein, ur 30 (*)    Leukocytes, UA LARGE (*)    All other components within normal limits  URINE MICROSCOPIC-ADD ON - Abnormal; Notable for the following:    Squamous Epithelial / LPF TOO NUMEROUS TO COUNT (*)    Bacteria, UA MANY (*)    All other components within normal limits  PREGNANCY, URINE    EKG  EKG Interpretation None       Radiology No results found.  Procedures Procedures (including critical care time)  Medications Ordered in ED Medications - No data to display   Initial Impression / Assessment and Plan / ED Course  I have reviewed the triage vital signs and the nursing notes.  Pertinent labs & imaging results that were available during my care of the patient were reviewed by me and considered in my medical decision making (see chart for details).  Clinical Course    **I have reviewed nursing notes, vital signs, and all appropriate lab and imaging results for this patient.*  Pt diagnosed with a UTI. Pt is afebrile, tachycardia, hypotension, or other signs of serious infection.  Pt to be dc home with antibiotics and instructions to follow up with PCP if symptoms persist. Discussed return precautions. Pt appears safe for discharge.  Final Clinical Impressions(s) / ED  Rx keflex given.   Final diagnoses:  None    New Prescriptions New Prescriptions   No  medications on file   **I personally performed the services described in this documentation, which was scribed in my presence. The recorded information has been reviewed and is accurate.Lily Kocher, PA-C 07/03/16 1220    Milton Ferguson, MD 07/06/16 1021

## 2016-07-15 ENCOUNTER — Ambulatory Visit (INDEPENDENT_AMBULATORY_CARE_PROVIDER_SITE_OTHER): Payer: Self-pay | Admitting: Family Medicine

## 2016-07-15 ENCOUNTER — Encounter: Payer: Self-pay | Admitting: Family Medicine

## 2016-07-15 DIAGNOSIS — F449 Dissociative and conversion disorder, unspecified: Secondary | ICD-10-CM
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
## 2016-10-30 ENCOUNTER — Emergency Department (HOSPITAL_COMMUNITY)
Admission: EM | Admit: 2016-10-30 | Discharge: 2016-10-30 | Disposition: A | Discharge status: Home / Self Care | Payer: Medicaid Other | Attending: Emergency Medicine | Admitting: Emergency Medicine

## 2016-10-30 ENCOUNTER — Encounter (HOSPITAL_COMMUNITY): Payer: Self-pay

## 2016-10-30 ENCOUNTER — Emergency Department (HOSPITAL_COMMUNITY): Payer: Medicaid Other

## 2016-10-30 DIAGNOSIS — Y69 Unspecified misadventure during surgical and medical care: Secondary | ICD-10-CM | POA: Insufficient documentation

## 2016-10-30 DIAGNOSIS — L270 Generalized skin eruption due to drugs and medicaments taken internally: Secondary | ICD-10-CM

## 2016-10-30 DIAGNOSIS — Z87891 Personal history of nicotine dependence: Secondary | ICD-10-CM | POA: Insufficient documentation

## 2016-10-30 DIAGNOSIS — T887XXA Unspecified adverse effect of drug or medicament, initial encounter: Secondary | ICD-10-CM | POA: Diagnosis not present

## 2016-10-30 DIAGNOSIS — T361X5A Adverse effect of cephalosporins and other beta-lactam antibiotics, initial encounter: Secondary | ICD-10-CM | POA: Insufficient documentation

## 2016-10-30 DIAGNOSIS — R1084 Generalized abdominal pain: Secondary | ICD-10-CM | POA: Diagnosis not present

## 2016-10-30 LAB — CBC WITH DIFFERENTIAL/PLATELET
Basophils Absolute: 0 10*3/uL (ref 0.0–0.1)
Basophils Relative: 0 %
EOS ABS: 0.2 10*3/uL (ref 0.0–0.7)
EOS PCT: 2 %
HCT: 39.7 % (ref 36.0–46.0)
Hemoglobin: 13 g/dL (ref 12.0–15.0)
LYMPHS ABS: 2.4 10*3/uL (ref 0.7–4.0)
LYMPHS PCT: 27 %
MCH: 28.8 pg (ref 26.0–34.0)
MCHC: 32.7 g/dL (ref 30.0–36.0)
MCV: 87.8 fL (ref 78.0–100.0)
MONO ABS: 0.3 10*3/uL (ref 0.1–1.0)
Monocytes Relative: 4 %
Neutro Abs: 6.1 10*3/uL (ref 1.7–7.7)
Neutrophils Relative %: 67 %
PLATELETS: 396 10*3/uL (ref 150–400)
RBC: 4.52 MIL/uL (ref 3.87–5.11)
RDW: 13.7 % (ref 11.5–15.5)
WBC: 9.1 10*3/uL (ref 4.0–10.5)

## 2016-10-30 LAB — POC URINE PREG, ED: Preg Test, Ur: NEGATIVE

## 2016-10-30 LAB — COMPREHENSIVE METABOLIC PANEL
ALK PHOS: 59 U/L (ref 38–126)
ALT: 22 U/L (ref 14–54)
AST: 19 U/L (ref 15–41)
Albumin: 4.4 g/dL (ref 3.5–5.0)
Anion gap: 8 (ref 5–15)
BUN: 14 mg/dL (ref 6–20)
CALCIUM: 9 mg/dL (ref 8.9–10.3)
CHLORIDE: 101 mmol/L (ref 101–111)
CO2: 28 mmol/L (ref 22–32)
CREATININE: 0.67 mg/dL (ref 0.44–1.00)
GFR calc Af Amer: >60 mL/min (ref >60)
Glucose, Bld: 89 mg/dL (ref 65–99)
Potassium: 3.4 mmol/L — ABNORMAL LOW (ref 3.5–5.1)
SODIUM: 137 mmol/L (ref 135–145)
Total Bilirubin: 0.4 mg/dL (ref 0.3–1.2)
Total Protein: 7.9 g/dL (ref 6.5–8.1)

## 2016-10-30 LAB — LIPASE, BLOOD: Lipase: 31 U/L (ref 11–51)

## 2016-10-30 MED ORDER — DIPHENHYDRAMINE HCL 25 MG PO TABS: 25.0000 mg | ORAL_TABLET | Freq: Four times a day (QID) | ORAL | 0 refills | Status: DC

## 2016-10-30 MED ORDER — IOPAMIDOL (ISOVUE-300) INJECTION 61%
100.0000 mL | Freq: Once | INTRAVENOUS | Status: AC | PRN
  Administered 2016-10-30: 100 mL via INTRAVENOUS

## 2016-10-30 MED ORDER — FAMOTIDINE IN NACL 20-0.9 MG/50ML-% IV SOLN
20.0000 mg | Freq: Once | INTRAVENOUS | Status: AC
  Administered 2016-10-30: 20 mg via INTRAVENOUS
  Filled 2016-10-30: qty 50

## 2016-10-30 MED ORDER — DOXYCYCLINE HYCLATE 100 MG PO CAPS: 100.0000 mg | ORAL_CAPSULE | Freq: Two times a day (BID) | ORAL | 0 refills | Status: DC

## 2016-10-30 MED ORDER — METHYLPREDNISOLONE SODIUM SUCC 125 MG IJ SOLR
125.0000 mg | Freq: Once | INTRAMUSCULAR | Status: AC
  Administered 2016-10-30: 125 mg via INTRAVENOUS
  Filled 2016-10-30: qty 2

## 2016-10-30 MED ORDER — SODIUM CHLORIDE 0.9 % IV BOLUS (SEPSIS)
500.0000 mL | Freq: Once | INTRAVENOUS | Status: AC
  Administered 2016-10-30: 500 mL via INTRAVENOUS

## 2016-10-30 MED ORDER — SODIUM CHLORIDE 0.9 % IV SOLN
INTRAVENOUS | Status: DC
  Administered 2016-10-30: 18:00:00 via INTRAVENOUS

## 2016-10-30 MED ORDER — FAMOTIDINE 20 MG PO TABS: 20.0000 mg | ORAL_TABLET | Freq: Two times a day (BID) | ORAL | 0 refills | Status: DC

## 2016-10-30 MED ORDER — IOPAMIDOL (ISOVUE-300) INJECTION 61%
INTRAVENOUS | Status: AC
  Administered 2016-10-30: 15 mL via ORAL
  Filled 2016-10-30: qty 30

## 2016-10-30 MED ORDER — DIPHENHYDRAMINE HCL 50 MG/ML IJ SOLN
25.0000 mg | Freq: Once | INTRAMUSCULAR | Status: AC
Start: 2016-10-30 — End: 2016-10-30
  Administered 2016-10-30: 25 mg via INTRAVENOUS
  Filled 2016-10-30: qty 1

## 2016-10-30 MED ORDER — ONDANSETRON HCL 4 MG/2ML IJ SOLN
4.0000 mg | Freq: Once | INTRAMUSCULAR | Status: AC
  Administered 2016-10-30: 4 mg via INTRAVENOUS
  Filled 2016-10-30: qty 2

## 2016-10-30 NOTE — ED Triage Notes (Signed)
Patient had cholecystectomy 10-21-16 by Dr. Liliane Channel at [place] [facility]. Reports of waking up with redness to abdomen nausea and diarrhea. Patient abdomen has significant amount of redness noted to global abdomen. Tenderness noted. Currently taking Keflex 500mg .

## 2016-10-30 NOTE — ED Provider Notes (Addendum)
[facility] DEPT Provider Note   CSN: [id number] Arrival date & time: 10/30/16  1619     History   Chief Complaint Chief Complaint  Patient presents with  . Abdominal Pain    HPI Grace Sheppard is a 20 y.o. female.  Patient status post gallbladder removal December 22. Patient has been on Keflex since discharge from the hospital as per the orders of her surgeon. Patient with a red rash on her abdomen chest and upper back. That started this morning. Patient was some abdominal discomfort throughout the abdomen. No vomiting. Has had some loose bowel movements and some nausea. No fevers. No drainage from the surgical wounds. No known antibiotic allergies.      Past Medical History:  Diagnosis Date  . Anxiety   . Conversion disorder   . Post partum depression   . Pseudoseizures   . Syncope    recurrent    Patient Active Problem List   Diagnosis Date Noted  . Conversion reaction 07/15/2016    Past Surgical History:  Procedure Laterality Date  . CHOLECYSTECTOMY    . TUBAL LIGATION      OB History    Gravida Para Term Preterm AB Living   1 1 1          SAB TAB Ectopic Multiple Live Births                   Home Medications    Prior to Admission medications   Medication Sig Start Date End Date Taking? Authorizing Provider  cephALEXin (KEFLEX) 500 MG capsule Take 500 mg by mouth 3 (three) times daily.   Yes Historical Provider, MD  ibuprofen (ADVIL,MOTRIN) 600 MG tablet Take 600 mg by mouth every 6 (six) hours as needed for moderate pain.   Yes Historical Provider, MD  diphenhydrAMINE (BENADRYL) 25 MG tablet Take 1 tablet (25 mg total) by mouth every 6 (six) hours. 10/30/16   Fredia Sorrow, MD  doxycycline (VIBRAMYCIN) 100 MG capsule Take 1 capsule (100 mg total) by mouth 2 (two) times daily. 10/30/16   Fredia Sorrow, MD  famotidine (PEPCID) 20 MG tablet Take 1 tablet (20 mg total) by mouth 2 (two) times daily. 10/30/16   Fredia Sorrow, MD    Family  History No family history on file.  Social History Social History  Substance Use Topics  . Smoking status: Former Smoker    Packs/day: 0.02    Years: 1.50    Types: Cigarettes    Quit date: 12/29/2013  . Smokeless tobacco: Never Used  . Alcohol use No     Allergies   Keflex [cephalexin]   Review of Systems Review of Systems  Constitutional: Negative for fever.  HENT: Negative for congestion, facial swelling and trouble swallowing.   Eyes: Negative for redness.  Respiratory: Negative for shortness of breath.   Gastrointestinal: Positive for abdominal pain.  Genitourinary: Negative for dysuria.  Musculoskeletal: Negative for back pain.  Skin: Positive for rash and wound.  Neurological: Negative for headaches.  Hematological: Does not bruise/bleed easily.     Physical Exam Updated Vital Signs Pulse 87   Temp 98 F (36.7 C) (Oral)   Resp 16   Ht 5\' 4"  (1.626 m)   Wt 69.9 kg   LMP 10/27/2016 Comment: 12/31/1 NEG U PREG  SpO2 100%   BMI 26.43 kg/m   Physical Exam  Constitutional: She is oriented to person, place, and time. She appears well-developed and well-nourished. No distress.  HENT:  Head: Normocephalic  and atraumatic.  No swelling to lips or tongue.  Eyes: EOM are normal. Pupils are equal, round, and reactive to light.  Neck: Normal range of motion. Neck supple.  Cardiovascular: Normal rate, regular rhythm and normal heart sounds.   Pulmonary/Chest: Effort normal and breath sounds normal.  Abdominal: Soft. Bowel sounds are normal. There is tenderness.  Surgical incisions healing well. Mild tenderness to abdomen. No purulent discharge. No fluctuance no significant induration.  Neurological: She is alert and oriented to person, place, and time. No cranial nerve deficit or sensory deficit. She exhibits normal muscle tone. Coordination normal.  Skin: Skin is warm. Rash noted. There is erythema.  Significant erythema encompassing almost the entire abdomen also  anterior chest upper back. Some papules no blisters.  Nursing note and vitals reviewed.    ED Treatments / Results  Labs (all labs ordered are listed, but only abnormal results are displayed) Labs Reviewed  COMPREHENSIVE METABOLIC PANEL - Abnormal; Notable for the following:       Result Value   Potassium 3.4 (*)    All other components within normal limits  LIPASE, BLOOD  CBC WITH DIFFERENTIAL/PLATELET  POC URINE PREG, ED    EKG  EKG Interpretation None       Radiology Ct Abdomen Pelvis W Contrast  Result Date: 10/30/2016 CLINICAL DATA:  Patient with recent cholecystectomy. Abdominal redness. Diarrhea. EXAM: CT ABDOMEN AND PELVIS WITH CONTRAST TECHNIQUE: Multidetector CT imaging of the abdomen and pelvis was performed using the standard protocol following bolus administration of intravenous contrast. CONTRAST:  56mL ISOVUE-300 IOPAMIDOL (ISOVUE-300) INJECTION 61%, 158mL ISOVUE-300 IOPAMIDOL (ISOVUE-300) INJECTION 61% COMPARISON:  CT abdomen pelvis 09/03/2016. FINDINGS: Lower chest: Normal heart size. Lung bases are clear. No pleural effusion. Hepatobiliary: Liver is normal in size and contour. No focal hepatic lesion is identified. Fatty deposition adjacent to the falciform ligament. Patient status post cholecystectomy. Postsurgical changes within the gallbladder fossa. Pancreas: Unremarkable Spleen: Unremarkable Adrenals/Urinary Tract: The adrenal glands are normal. Kidneys enhance symmetrically with contrast. No hydronephrosis. Stomach/Bowel: No abnormal bowel wall thickening or evidence for bowel obstruction. No free fluid or free intraperitoneal air. Normal morphology of the stomach. Vascular/Lymphatic: Normal caliber abdominal aorta. No retroperitoneal lymphadenopathy. Reproductive: Uterus and adnexal structures are unremarkable. Other: There is thickening of the skin overlying the anterior abdomen. No underlying fluid collection within the subcutaneous fat of the anterior  abdominal wall. Musculoskeletal: No aggressive or acute appearing osseous lesions. IMPRESSION: Thickening of the skin overlying the anterior abdominal wall as can be seen with cellulitis. No evidence for underlying abscess within the subcutaneous fat of the anterior abdominal wall. Postsurgical changes compatible with recent cholecystectomy. No intra-abdominal abscess identified. Electronically Signed   By: Lovey Newcomer M.D.   On: 10/30/2016 18:51    Procedures Procedures (including critical care time)  Medications Ordered in ED Medications  0.9 %  sodium chloride infusion ( Intravenous New Bag/Given 10/30/16 1747)  sodium chloride 0.9 % bolus 500 mL (0 mLs Intravenous Stopped 10/30/16 1847)  ondansetron (ZOFRAN) injection 4 mg (4 mg Intravenous Given 10/30/16 1748)  diphenhydrAMINE (BENADRYL) injection 25 mg (25 mg Intravenous Given 10/30/16 1748)  famotidine (PEPCID) IVPB 20 mg premix (0 mg Intravenous Stopped 10/30/16 1817)  methylPREDNISolone sodium succinate (SOLU-MEDROL) 125 mg/2 mL injection 125 mg (125 mg Intravenous Given 10/30/16 1748)  iopamidol (ISOVUE-300) 61 % injection (15 mLs Oral Contrast Given 10/30/16 1819)  iopamidol (ISOVUE-300) 61 % injection 100 mL (100 mLs Intravenous Contrast Given 10/30/16 1820)     Initial  Impression / Assessment and Plan / ED Course  I have reviewed the triage vital signs and the nursing notes.  Pertinent labs & imaging results that were available during my care of the patient were reviewed by me and considered in my medical decision making (see chart for details).  Clinical Course    Patients erythema on the abdomen and upper chest some on the upper back consistent with antibiotic drug rash. Treated here was signed Medrol Benadryl Pepcid with significant resolution. Patient also with some abdominal pain since she had the recent surgery on December 22 did CT of abdomen make sure no acute abnormalities. CT was negative. Labs without significant  abnormalities. Cellulitis not completely ruled out patient will be changed from Keflex to doxycycline take Pepcid for 7 days and take Benadryl for the next 2 days and then as needed. Do not feel that there is anything significant going on inside the abdomen. This is supported by labs and by the CT findings.   Final Clinical Impressions(s) / ED Diagnoses   Final diagnoses:  Cephalosporin drug rash  Generalized abdominal pain    New Prescriptions New Prescriptions   DIPHENHYDRAMINE (BENADRYL) 25 MG TABLET    Take 1 tablet (25 mg total) by mouth every 6 (six) hours.   DOXYCYCLINE (VIBRAMYCIN) 100 MG CAPSULE    Take 1 capsule (100 mg total) by mouth 2 (two) times daily.   FAMOTIDINE (PEPCID) 20 MG TABLET    Take 1 tablet (20 mg total) by mouth 2 (two) times daily.     Fredia Sorrow, MD 10/30/16 [place], MD 10/30/16 [id number]

## 2016-10-30 NOTE — Discharge Instructions (Signed)
Postoperative Keflex. Take the doxycycline. Also take Pepcid for the next 7 days. Take Benadryl every 6 hours for the next 2 days then as needed for the rash. Return for any new or worse symptoms. Follow-up with your surgeon as scheduled.

## 2016-11-22 ENCOUNTER — Emergency Department (HOSPITAL_COMMUNITY)
Admission: EM | Admit: 2016-11-22 | Discharge: 2016-11-23 | Disposition: A | Discharge status: Home / Self Care | Payer: Medicaid Other | Attending: Emergency Medicine | Admitting: Emergency Medicine

## 2016-11-22 ENCOUNTER — Encounter (HOSPITAL_COMMUNITY): Payer: Self-pay | Admitting: Emergency Medicine

## 2016-11-22 DIAGNOSIS — R51 Headache: Secondary | ICD-10-CM | POA: Insufficient documentation

## 2016-11-22 DIAGNOSIS — R05 Cough: Secondary | ICD-10-CM | POA: Insufficient documentation

## 2016-11-22 DIAGNOSIS — Z79899 Other long term (current) drug therapy: Secondary | ICD-10-CM | POA: Insufficient documentation

## 2016-11-22 DIAGNOSIS — R0981 Nasal congestion: Secondary | ICD-10-CM | POA: Insufficient documentation

## 2016-11-22 DIAGNOSIS — M549 Dorsalgia, unspecified: Secondary | ICD-10-CM | POA: Insufficient documentation

## 2016-11-22 DIAGNOSIS — J3489 Other specified disorders of nose and nasal sinuses: Secondary | ICD-10-CM | POA: Insufficient documentation

## 2016-11-22 DIAGNOSIS — R6889 Other general symptoms and signs: Secondary | ICD-10-CM

## 2016-11-22 DIAGNOSIS — R112 Nausea with vomiting, unspecified: Secondary | ICD-10-CM | POA: Insufficient documentation

## 2016-11-22 DIAGNOSIS — R300 Dysuria: Secondary | ICD-10-CM | POA: Insufficient documentation

## 2016-11-22 DIAGNOSIS — R109 Unspecified abdominal pain: Secondary | ICD-10-CM | POA: Insufficient documentation

## 2016-11-22 DIAGNOSIS — Z87891 Personal history of nicotine dependence: Secondary | ICD-10-CM | POA: Insufficient documentation

## 2016-11-22 DIAGNOSIS — R197 Diarrhea, unspecified: Secondary | ICD-10-CM | POA: Insufficient documentation

## 2016-11-22 LAB — CBC
HEMATOCRIT: 38.9 % (ref 36.0–46.0)
HEMOGLOBIN: 12.8 g/dL (ref 12.0–15.0)
MCH: 28.7 pg (ref 26.0–34.0)
MCHC: 32.9 g/dL (ref 30.0–36.0)
MCV: 87.2 fL (ref 78.0–100.0)
Platelets: 219 10*3/uL (ref 150–400)
RBC: 4.46 MIL/uL (ref 3.87–5.11)
RDW: 14 % (ref 11.5–15.5)
WBC: 4.7 10*3/uL (ref 4.0–10.5)

## 2016-11-22 LAB — COMPREHENSIVE METABOLIC PANEL
ALT: 21 U/L (ref 14–54)
ANION GAP: 7 (ref 5–15)
AST: 20 U/L (ref 15–41)
Albumin: 4 g/dL (ref 3.5–5.0)
Alkaline Phosphatase: 52 U/L (ref 38–126)
BUN: 7 mg/dL (ref 6–20)
CO2: 26 mmol/L (ref 22–32)
Calcium: 8.6 mg/dL — ABNORMAL LOW (ref 8.9–10.3)
Chloride: 100 mmol/L — ABNORMAL LOW (ref 101–111)
Creatinine, Ser: 0.64 mg/dL (ref 0.44–1.00)
Glucose, Bld: 93 mg/dL (ref 65–99)
POTASSIUM: 3.6 mmol/L (ref 3.5–5.1)
Sodium: 133 mmol/L — ABNORMAL LOW (ref 135–145)
TOTAL PROTEIN: 7.3 g/dL (ref 6.5–8.1)
Total Bilirubin: 0.2 mg/dL — ABNORMAL LOW (ref 0.3–1.2)

## 2016-11-22 LAB — URINALYSIS, MICROSCOPIC (REFLEX)

## 2016-11-22 LAB — URINALYSIS, ROUTINE W REFLEX MICROSCOPIC
Bilirubin Urine: NEGATIVE
GLUCOSE, UA: NEGATIVE mg/dL
Ketones, ur: NEGATIVE mg/dL
NITRITE: NEGATIVE
PH: 5.5 (ref 5.0–8.0)
PROTEIN: 30 mg/dL — AB
Specific Gravity, Urine: <1.005 — ABNORMAL LOW (ref 1.005–1.030)

## 2016-11-22 LAB — LIPASE, BLOOD: LIPASE: 23 U/L (ref 11–51)

## 2016-11-22 MED ORDER — SODIUM CHLORIDE 0.9 % IV BOLUS (SEPSIS)
1000.0000 mL | Freq: Once | INTRAVENOUS | Status: AC
  Administered 2016-11-22: 1000 mL via INTRAVENOUS

## 2016-11-22 MED ORDER — ONDANSETRON HCL 4 MG/2ML IJ SOLN
4.0000 mg | Freq: Once | INTRAMUSCULAR | Status: AC
  Administered 2016-11-22: 4 mg via INTRAVENOUS
  Filled 2016-11-22: qty 2

## 2016-11-22 MED ORDER — SODIUM CHLORIDE 0.9 % IV SOLN: INTRAVENOUS | Status: DC

## 2016-11-22 NOTE — ED Triage Notes (Signed)
Pt c/o n/v/d/cough/congestion/body aches x 2 days.

## 2016-11-22 NOTE — ED Provider Notes (Signed)
[facility] DEPT Provider Note   CSN: [id number] Arrival date & time: 11/22/16  1746   By signing my name below, I, Grace Sheppard, attest that this documentation has been prepared under the direction and in the presence of Fredia Sorrow, MD. Electronically Signed: Hilbert Sheppard, Scribe. 11/22/16. 10:55 PM. History   Chief Complaint Chief Complaint  Patient presents with  . Abdominal Pain    HPI Grace Sheppard is a 21 y.o. female.  She presents today with generalized body aches for the past 2 days. She also reports associated nausea, vomiting, diarrhea, and congestion. She reports vomit x 5 today. She reports only a few episodes of diarrhea today. She denies any severe abdominal pain. She states that she feel very weak and fatigued. It is noted that the patient recently had her gallbladder removed on December 22nd.   The history is provided by the patient. No language interpreter was used.  Abdominal Pain   This is a new problem. The current episode started 2 days ago. The problem has not changed since onset.The pain is mild. Associated symptoms include diarrhea, nausea, vomiting, dysuria and headaches. Pertinent negatives include fever and hematuria. Past medical history comments: Cholecystectomy on 10/21/2016.     Past Medical History:  Diagnosis Date  . Anxiety   . Conversion disorder   . Post partum depression   . Pseudoseizures   . Syncope    recurrent    Patient Active Problem List   Diagnosis Date Noted  . Conversion reaction 07/15/2016    Past Surgical History:  Procedure Laterality Date  . CHOLECYSTECTOMY    . TUBAL LIGATION      OB History    Gravida Para Term Preterm AB Living   1 1 1          SAB TAB Ectopic Multiple Live Births                   Home Medications    Prior to Admission medications   Medication Sig Start Date End Date Taking? Authorizing Provider  acetaminophen (TYLENOL) 500 MG tablet Take 1,000 mg by mouth every 6 (six)  hours as needed for mild pain or moderate pain.   Yes Historical Provider, MD  Ibuprofen 200 MG CAPS Take 600 mg by mouth every 6 (six) hours as needed for mild pain or moderate pain.    Yes Historical Provider, MD  diphenhydrAMINE (BENADRYL) 25 MG tablet Take 1 tablet (25 mg total) by mouth every 6 (six) hours. Patient not taking: Reported on 11/22/2016 10/30/16   Fredia Sorrow, MD  doxycycline (VIBRAMYCIN) 100 MG capsule Take 1 capsule (100 mg total) by mouth 2 (two) times daily. Patient not taking: Reported on 11/22/2016 10/30/16   Fredia Sorrow, MD  famotidine (PEPCID) 20 MG tablet Take 1 tablet (20 mg total) by mouth 2 (two) times daily. Patient not taking: Reported on 11/22/2016 10/30/16   Fredia Sorrow, MD  loperamide (IMODIUM A-D) 2 MG tablet Take 1 tablet (2 mg total) by mouth 4 (four) times daily as needed for diarrhea or loose stools. 11/23/16   Fredia Sorrow, MD  naproxen (NAPROSYN) 500 MG tablet Take 1 tablet (500 mg total) by mouth 2 (two) times daily. 11/23/16   Fredia Sorrow, MD  ondansetron (ZOFRAN ODT) 4 MG disintegrating tablet Take 1 tablet (4 mg total) by mouth every 8 (eight) hours as needed. 11/23/16   Fredia Sorrow, MD  promethazine (PHENERGAN) 25 MG tablet Take 1 tablet (25 mg total) by mouth every  6 (six) hours as needed. 11/23/16   Fredia Sorrow, MD    Family History No family history on file.  Social History Social History  Substance Use Topics  . Smoking status: Former Smoker    Packs/day: 0.02    Years: 1.50    Types: Cigarettes    Quit date: 12/29/2013  . Smokeless tobacco: Never Used  . Alcohol use No     Allergies   Keflex [cephalexin]   Review of Systems Review of Systems  Constitutional: Positive for chills. Negative for fever.  HENT: Positive for congestion and rhinorrhea.   Eyes: Negative for visual disturbance.  Respiratory: Positive for cough. Negative for shortness of breath.   Cardiovascular: Negative for chest pain.    Gastrointestinal: Positive for diarrhea, nausea and vomiting. Negative for abdominal pain and blood in stool.  Genitourinary: Positive for dysuria. Negative for hematuria.  Musculoskeletal: Positive for back pain.  Skin: Negative for rash.  Neurological: Positive for headaches.  Hematological: Does not bruise/bleed easily.  Psychiatric/Behavioral: Negative for confusion.  All other systems reviewed and are negative.    Physical Exam Updated Vital Signs BP 125/88 (BP Location: Left Arm)   Pulse 76   Temp 98.1 F (36.7 C)   Resp 18   Ht 5\' 4"  (1.626 m)   Wt 69.9 kg   LMP 11/22/2016 Comment: NEG U PREG 10/30/16  SpO2 100%   BMI 26.43 kg/m   Physical Exam  Constitutional: She is oriented to person, place, and time.  HENT:  Head: Normocephalic and atraumatic.  Mouth/Throat: Mucous membranes are dry.  Eyes: EOM are normal. Pupils are equal, round, and reactive to light. No scleral icterus.  Neck: Normal range of motion.  Cardiovascular: Normal rate and regular rhythm.   No swelling in ankles.  Pulmonary/Chest: Effort normal and breath sounds normal.  Abdominal: Bowel sounds are normal. There is no tenderness.  Musculoskeletal: Normal range of motion.  Neurological: She is alert and oriented to person, place, and time.  Skin: Skin is warm and dry.  Nursing note and vitals reviewed.  ED Treatments / Results  DIAGNOSTIC STUDIES: Oxygen Saturation is 100% on RA, normal by my interpretation.    COORDINATION OF CARE: 10:39 PM Discussed treatment plan with pt at bedside and pt agreed to plan.  Labs (all labs ordered are listed, but only abnormal results are displayed) Labs Reviewed  COMPREHENSIVE METABOLIC PANEL - Abnormal; Notable for the following:       Result Value   Sodium 133 (*)    Chloride 100 (*)    Calcium 8.6 (*)    Total Bilirubin 0.2 (*)    All other components within normal limits  URINALYSIS, ROUTINE W REFLEX MICROSCOPIC - Abnormal; Notable for the  following:    Specific Gravity, Urine <1.005 (*)    Hgb urine dipstick LARGE (*)    Protein, ur 30 (*)    Leukocytes, UA TRACE (*)    All other components within normal limits  URINALYSIS, MICROSCOPIC (REFLEX) - Abnormal; Notable for the following:    Bacteria, UA MANY (*)    Squamous Epithelial / LPF 0-5 (*)    All other components within normal limits  URINE CULTURE  LIPASE, BLOOD  CBC    EKG  EKG Interpretation None       Radiology No results found.  Procedures Procedures (including critical care time)  Medications Ordered in ED Medications  0.9 %  sodium chloride infusion (not administered)  sodium chloride 0.9 % bolus 1,000  mL (not administered)  ondansetron (ZOFRAN) injection 4 mg (not administered)  sodium chloride 0.9 % bolus 1,000 mL (1,000 mLs Intravenous New Bag/Given 11/22/16 2310)  ondansetron (ZOFRAN) injection 4 mg (4 mg Intravenous Given 11/22/16 2309)     Initial Impression / Assessment and Plan / ED Course  I have reviewed the triage vital signs and the nursing notes.  Pertinent labs & imaging results that were available during my care of the patient were reviewed by me and considered in my medical decision making (see chart for details).   symptoms are consistent with viral type illness somewhat flulike in that she has bodyaches congestion early cough as well as now nausea vomiting and some diarrhea. Patient nontoxic no acute distress. Labs without significant abnormalities. Patient received 2 L of fluid here as well as antinausea medicine. Patient's urinalysis lots of blood in it but patient is on her menses. Urine sent for culture but do not feel is any evidence urinary tract infection. Patient will be treated symptomatically with Naprosyn, Zofran, Phenergan, Imodium and if cough and congestion gets worse recommend over-the-counter Mucinex DM. Patient stable for discharge home.    Final Clinical Impressions(s) / ED Diagnoses   Final diagnoses:    Flu-like symptoms    New Prescriptions New Prescriptions   LOPERAMIDE (IMODIUM A-D) 2 MG TABLET    Take 1 tablet (2 mg total) by mouth 4 (four) times daily as needed for diarrhea or loose stools.   NAPROXEN (NAPROSYN) 500 MG TABLET    Take 1 tablet (500 mg total) by mouth 2 (two) times daily.   ONDANSETRON (ZOFRAN ODT) 4 MG DISINTEGRATING TABLET    Take 1 tablet (4 mg total) by mouth every 8 (eight) hours as needed.   PROMETHAZINE (PHENERGAN) 25 MG TABLET    Take 1 tablet (25 mg total) by mouth every 6 (six) hours as needed.   I personally performed the services described in this documentation, which was scribed in my presence. The recorded information has been reviewed and is accurate.      Fredia Sorrow, MD 11/23/16 [id number]

## 2016-11-23 MED ORDER — ONDANSETRON 4 MG PO TBDP: 4.0000 mg | ORAL_TABLET | Freq: Three times a day (TID) | ORAL | 1 refills | Status: DC | PRN

## 2016-11-23 MED ORDER — PROMETHAZINE HCL 25 MG PO TABS: 25.0000 mg | ORAL_TABLET | Freq: Four times a day (QID) | ORAL | 1 refills | Status: DC | PRN

## 2016-11-23 MED ORDER — NAPROXEN 500 MG PO TABS: 500.0000 mg | ORAL_TABLET | Freq: Two times a day (BID) | ORAL | 0 refills | Status: DC

## 2016-11-23 MED ORDER — ONDANSETRON HCL 4 MG/2ML IJ SOLN
4.0000 mg | Freq: Once | INTRAMUSCULAR | Status: AC
  Administered 2016-11-23: 4 mg via INTRAVENOUS
  Filled 2016-11-23: qty 2

## 2016-11-23 MED ORDER — LOPERAMIDE HCL 2 MG PO TABS: 2.0000 mg | ORAL_TABLET | Freq: Four times a day (QID) | ORAL | 0 refills | Status: DC | PRN

## 2016-11-23 MED ORDER — SODIUM CHLORIDE 0.9 % IV BOLUS (SEPSIS)
1000.0000 mL | Freq: Once | INTRAVENOUS | Status: AC
  Administered 2016-11-23: 1000 mL via INTRAVENOUS

## 2016-11-23 NOTE — ED Notes (Signed)
Pt ambulatory to waiting room. Pt verbalized understanding of discharge instructions.   

## 2016-11-23 NOTE — ED Notes (Signed)
Pt given sprite to drink. No complaints of n/v at this time

## 2016-11-23 NOTE — Discharge Instructions (Signed)
Rest at home. Take the Naprosyn for the bodyaches. Take Zofran and/or Phenergan for the nausea and vomiting. Take the Imodium for the diarrhea. Small amounts of fluids frequently advance to bland diet as tolerated. Return for any new or worse symptoms. Symptoms seem to be flulike in nature. If cough and congestion gets worse recommend Mucinex DM over-the-counter.

## 2016-11-24 LAB — URINE CULTURE

## 2017-01-07 ENCOUNTER — Encounter (HOSPITAL_COMMUNITY): Payer: Self-pay | Admitting: Emergency Medicine

## 2017-01-07 ENCOUNTER — Emergency Department (HOSPITAL_COMMUNITY)
Admission: EM | Admit: 2017-01-07 | Discharge: 2017-01-07 | Disposition: A | Discharge status: Home / Self Care | Payer: Medicaid Other | Attending: Emergency Medicine | Admitting: Emergency Medicine

## 2017-01-07 DIAGNOSIS — R509 Fever, unspecified: Secondary | ICD-10-CM | POA: Insufficient documentation

## 2017-01-07 DIAGNOSIS — R69 Illness, unspecified: Secondary | ICD-10-CM

## 2017-01-07 DIAGNOSIS — R5383 Other fatigue: Secondary | ICD-10-CM | POA: Insufficient documentation

## 2017-01-07 DIAGNOSIS — J111 Influenza due to unidentified influenza virus with other respiratory manifestations: Secondary | ICD-10-CM

## 2017-01-07 DIAGNOSIS — Z87891 Personal history of nicotine dependence: Secondary | ICD-10-CM | POA: Insufficient documentation

## 2017-01-07 DIAGNOSIS — R52 Pain, unspecified: Secondary | ICD-10-CM | POA: Insufficient documentation

## 2017-01-07 DIAGNOSIS — R110 Nausea: Secondary | ICD-10-CM | POA: Insufficient documentation

## 2017-01-07 DIAGNOSIS — R05 Cough: Secondary | ICD-10-CM | POA: Insufficient documentation

## 2017-01-07 DIAGNOSIS — R0981 Nasal congestion: Secondary | ICD-10-CM | POA: Insufficient documentation

## 2017-01-07 DIAGNOSIS — J3489 Other specified disorders of nose and nasal sinuses: Secondary | ICD-10-CM | POA: Insufficient documentation

## 2017-01-07 LAB — CBG MONITORING, ED: GLUCOSE-CAPILLARY: 98 mg/dL (ref 65–99)

## 2017-01-07 LAB — I-STAT CHEM 8, ED
BUN: 12 mg/dL (ref 6–20)
CHLORIDE: 104 mmol/L (ref 101–111)
CREATININE: 0.7 mg/dL (ref 0.44–1.00)
Calcium, Ion: 1.12 mmol/L — ABNORMAL LOW (ref 1.15–1.40)
Glucose, Bld: 62 mg/dL — ABNORMAL LOW (ref 65–99)
HEMATOCRIT: 40 % (ref 36.0–46.0)
Hemoglobin: 13.6 g/dL (ref 12.0–15.0)
POTASSIUM: 3.8 mmol/L (ref 3.5–5.1)
SODIUM: 139 mmol/L (ref 135–145)
TCO2: 26 mmol/L (ref 0–100)

## 2017-01-07 LAB — URINALYSIS, ROUTINE W REFLEX MICROSCOPIC
Bilirubin Urine: NEGATIVE
GLUCOSE, UA: NEGATIVE mg/dL
Hgb urine dipstick: NEGATIVE
Ketones, ur: NEGATIVE mg/dL
LEUKOCYTES UA: NEGATIVE
NITRITE: NEGATIVE
PH: 5 (ref 5.0–8.0)
Protein, ur: NEGATIVE mg/dL
SPECIFIC GRAVITY, URINE: 1.003 — AB (ref 1.005–1.030)

## 2017-01-07 LAB — POC URINE PREG, ED: Preg Test, Ur: NEGATIVE

## 2017-01-07 LAB — INFLUENZA PANEL BY PCR (TYPE A & B)
INFLBPCR: NEGATIVE
Influenza A By PCR: NEGATIVE

## 2017-01-07 MED ORDER — ONDANSETRON 8 MG PO TBDP
8.0000 mg | ORAL_TABLET | Freq: Once | ORAL | Status: AC
  Administered 2017-01-07: 8 mg via ORAL
  Filled 2017-01-07: qty 1

## 2017-01-07 MED ORDER — ONDANSETRON 8 MG PO TBDP: 8.0000 mg | ORAL_TABLET | Freq: Three times a day (TID) | ORAL | 0 refills | Status: DC | PRN

## 2017-01-07 NOTE — ED Notes (Signed)
Crackers and ginger ale given to pt 

## 2017-01-07 NOTE — ED Triage Notes (Signed)
Pt reports generalized body aches, low grade fever, cough, congestion, and fatigue since yesterday.

## 2017-01-07 NOTE — ED Notes (Signed)
Pt made aware to return if symptoms worsen or if any life threatening symptoms occur.   

## 2017-01-07 NOTE — Discharge Instructions (Signed)
Rest,  Drink plenty of fluids.  Take motrin or tylenol for achiness and fever reduction.   Get rechecked for increased shortness of breath,  Increased fever or increasing weakness. Your lab tests are ok today and your flu test is negative.

## 2017-01-08 NOTE — ED Provider Notes (Signed)
[facility] DEPT Provider Note   CSN: [id number] Arrival date & time: 01/07/17  4081     History   Chief Complaint Chief Complaint  Patient presents with  . Generalized Body Aches    HPI Grace Sheppard is a 21 y.o. female presenting with a  24 hour history of generalized body aches, subjective fever, nonproductive cough, nasal congestion with clear rhinorhea and generalized fatigue. She also endorses mild nausea without emesis. She reports having similar symptoms early January which was suspected to be influenza. However, that episode also included vomiting diarrhea which is not present today.  She was exposed to a family member with influenza several weeks ago and is concerned about possibly having recurrence.  She denies chest pain, sob, abdominal pain and has been urinating regularly and without pain. She is currently breast feeding. She has found no alleviators.  The history is provided by the patient.    Past Medical History:  Diagnosis Date  . Anxiety   . Conversion disorder   . Post partum depression   . Pseudoseizures   . Syncope    recurrent    Patient Active Problem List   Diagnosis Date Noted  . Conversion reaction 07/15/2016    Past Surgical History:  Procedure Laterality Date  . CHOLECYSTECTOMY    . TUBAL LIGATION      OB History    Gravida Para Term Preterm AB Living   1 1 1          SAB TAB Ectopic Multiple Live Births                   Home Medications    Prior to Admission medications   Medication Sig Start Date End Date Taking? Authorizing Provider  acetaminophen (TYLENOL) 500 MG tablet Take 1,000 mg by mouth every 6 (six) hours as needed for mild pain or moderate pain.   Yes Historical Provider, MD  cetirizine (ZYRTEC) 10 MG tablet Take 10 mg by mouth daily.   Yes Historical Provider, MD  montelukast (SINGULAIR) 10 MG tablet Take 10 mg by mouth at bedtime.   Yes Historical Provider, MD  Multiple Vitamins-Minerals (MULTIVITAMINS THER.  W/MINERALS) TABS tablet Take 1 tablet by mouth daily.   Yes Historical Provider, MD  ondansetron (ZOFRAN ODT) 8 MG disintegrating tablet Take 1 tablet (8 mg total) by mouth every 8 (eight) hours as needed for nausea or vomiting. 01/07/17   Evalee Jefferson, PA-C    Family History History reviewed. No pertinent family history.  Social History Social History  Substance Use Topics  . Smoking status: Former Smoker    Packs/day: 0.02    Years: 1.50    Types: Cigarettes    Quit date: 12/29/2013  . Smokeless tobacco: Never Used  . Alcohol use No     Allergies   Hydrocodone and Keflex [cephalexin]   Review of Systems Review of Systems  Constitutional: Positive for fatigue and fever.  HENT: Positive for congestion and rhinorrhea. Negative for sore throat.   Eyes: Negative.   Respiratory: Positive for cough. Negative for chest tightness, shortness of breath and wheezing.   Cardiovascular: Negative for chest pain.  Gastrointestinal: Positive for nausea. Negative for abdominal pain and vomiting.  Genitourinary: Negative.  Negative for decreased urine volume.  Musculoskeletal: Negative for arthralgias, joint swelling and neck pain.  Skin: Negative.  Negative for rash and wound.  Neurological: Negative for dizziness, weakness, light-headedness, numbness and headaches.  Psychiatric/Behavioral: Negative.      Physical Exam Updated  Vital Signs BP 117/89 (BP Location: Right Arm)   Pulse 84   Temp 98 F (36.7 C) (Oral)   Resp 17   Ht 5\' 5"  (1.651 m)   Wt 70.5 kg   LMP 12/26/2016   SpO2 100%   BMI 25.88 kg/m   Physical Exam  Constitutional: She is oriented to person, place, and time. She appears well-developed and well-nourished.  HENT:  Head: Normocephalic and atraumatic.  Right Ear: Tympanic membrane and ear canal normal.  Left Ear: Tympanic membrane and ear canal normal.  Nose: Mucosal edema and rhinorrhea present.  Mouth/Throat: Uvula is midline, oropharynx is clear and moist and  mucous membranes are normal. No oropharyngeal exudate, posterior oropharyngeal edema, posterior oropharyngeal erythema or tonsillar abscesses.  Eyes: Conjunctivae are normal.  Cardiovascular: Normal rate and normal heart sounds.   Pulmonary/Chest: Effort normal. No respiratory distress. She has no wheezes. She has no rhonchi. She has no rales.  Abdominal: Soft. There is no tenderness.  Musculoskeletal: Normal range of motion.  Neurological: She is alert and oriented to person, place, and time.  Skin: Skin is warm and dry. No rash noted.  Psychiatric: She has a normal mood and affect.     ED Treatments / Results  Labs (all labs ordered are listed, but only abnormal results are displayed)  Results for orders placed or performed during the hospital encounter of 01/07/17  Urinalysis, Routine w reflex microscopic  Result Value Ref Range   Color, Urine COLORLESS (A) YELLOW   APPearance CLEAR CLEAR   Specific Gravity, Urine 1.003 (L) 1.005 - 1.030   pH 5.0 5.0 - 8.0   Glucose, UA NEGATIVE NEGATIVE mg/dL   Hgb urine dipstick NEGATIVE NEGATIVE   Bilirubin Urine NEGATIVE NEGATIVE   Ketones, ur NEGATIVE NEGATIVE mg/dL   Protein, ur NEGATIVE NEGATIVE mg/dL   Nitrite NEGATIVE NEGATIVE   Leukocytes, UA NEGATIVE NEGATIVE  Influenza panel by PCR (type A & B)  Result Value Ref Range   Influenza A By PCR NEGATIVE NEGATIVE   Influenza B By PCR NEGATIVE NEGATIVE  POC urine preg, ED (not at [facility])  Result Value Ref Range   Preg Test, Ur NEGATIVE NEGATIVE  I-stat chem 8, ed  Result Value Ref Range   Sodium 139 135 - 145 mmol/L   Potassium 3.8 3.5 - 5.1 mmol/L   Chloride 104 101 - 111 mmol/L   BUN 12 6 - 20 mg/dL   Creatinine, Ser 0.70 0.44 - 1.00 mg/dL   Glucose, Bld 62 (L) 65 - 99 mg/dL   Calcium, Ion 1.12 (L) 1.15 - 1.40 mmol/L   TCO2 26 0 - 100 mmol/L   Hemoglobin 13.6 12.0 - 15.0 g/dL   HCT 40.0 36.0 - 46.0 %  POC CBG, ED  Result Value Ref Range   Glucose-Capillary 98 65 - 99 mg/dL    No results found.   EKG  EKG Interpretation None       Radiology No results found.  Procedures Procedures (including critical care time)  Medications Ordered in ED Medications  ondansetron (ZOFRAN-ODT) disintegrating tablet 8 mg (8 mg Oral Given 01/07/17 1108)     Initial Impression / Assessment and Plan / ED Course  I have reviewed the triage vital signs and the nursing notes.  Pertinent labs & imaging results that were available during my care of the patient were reviewed by me and considered in my medical decision making (see chart for details).     Pt with flu like sx, no  respiratory distress, influenza panel negative.  Suspect viral syndrome, advised rest, fluids, motrin/tylenol, prn f/u for any new or worsened sx.    Final Clinical Impressions(s) / ED Diagnoses   Final diagnoses:  Influenza-like illness    New Prescriptions Discharge Medication List as of 01/07/2017  1:37 PM    START taking these medications   Details  ondansetron (ZOFRAN ODT) 8 MG disintegrating tablet Take 1 tablet (8 mg total) by mouth every 8 (eight) hours as needed for nausea or vomiting., Starting Sat 01/07/2017, Print         Evalee Jefferson, PA-C 01/09/17 4827    Fredia Sorrow, MD 01/12/17 0800

## 2017-12-02 ENCOUNTER — Emergency Department (HOSPITAL_COMMUNITY)
Admission: EM | Admit: 2017-12-02 | Discharge: 2017-12-02 | Disposition: A | Discharge status: Home / Self Care | Payer: Medicaid Other | Attending: Emergency Medicine | Admitting: Emergency Medicine

## 2017-12-02 ENCOUNTER — Other Ambulatory Visit: Payer: Self-pay

## 2017-12-02 ENCOUNTER — Encounter (HOSPITAL_COMMUNITY): Payer: Self-pay

## 2017-12-02 DIAGNOSIS — R6884 Jaw pain: Secondary | ICD-10-CM | POA: Diagnosis present

## 2017-12-02 DIAGNOSIS — K029 Dental caries, unspecified: Secondary | ICD-10-CM | POA: Diagnosis not present

## 2017-12-02 DIAGNOSIS — R590 Localized enlarged lymph nodes: Secondary | ICD-10-CM | POA: Insufficient documentation

## 2017-12-02 DIAGNOSIS — Z79899 Other long term (current) drug therapy: Secondary | ICD-10-CM | POA: Diagnosis not present

## 2017-12-02 DIAGNOSIS — Z87891 Personal history of nicotine dependence: Secondary | ICD-10-CM | POA: Diagnosis not present

## 2017-12-02 DIAGNOSIS — R599 Enlarged lymph nodes, unspecified: Secondary | ICD-10-CM

## 2017-12-02 MED ORDER — CLINDAMYCIN HCL 300 MG PO CAPS: 300.0000 mg | ORAL_CAPSULE | Freq: Three times a day (TID) | ORAL | 0 refills | Status: AC

## 2017-12-02 NOTE — Discharge Instructions (Addendum)
You were given a prescription for antibiotics. Please take the antibiotic prescription fully.

## 2017-12-02 NOTE — ED Triage Notes (Signed)
Patient reports of a knot on right side of jaw 1 week ago. States she now has another on left side and unable to open mouth completely. Reports of pain.

## 2017-12-02 NOTE — ED Provider Notes (Signed)
[facility] EMERGENCY DEPARTMENT Provider Note   CSN: [id number] Arrival date & time: 12/02/17  1004     History   Chief Complaint Chief Complaint  Patient presents with  . Jaw Pain    HPI Grace Sheppard is a 22 y.o. female.  HPI   Patient is a 22 year old female who presents the ED today complaining of swollen lymph nodes to the anterior and posterior ear as well as the anterior neck that have been present and painful since 1/28.  She rates the pain at a 7 out of 10.  Pain is constant and does not radiate.  It hurts worse when she tries to swallow or when she opens her mouth.  She has taken Tylenol with some relief.  States that she has also had some mild intermittent frontal headaches since the onset of symptoms.  Headaches are mild and she states they she frequently gets headaches when she is about to get a cold.  This feels similar to her normal headaches,  And symptoms resolved with Tylenol.  She does not currently have a headache.  States she has a cavity to her right lower molar however she has not had any pain or infections to this tooth recently.  She denies any oral abscesses.  She denies any other associated symptoms including no fevers, chills, vision changes, photophobia, lightheadedness, dizziness, neck stiffness, rashes, sore throat, rhinorrhea, congestion, ear pain, difficulty swallowing, chest pain, shortness of breath, abdominal pain, nausea vomiting or diarrhea.  No night sweats, or unintended weight loss.  No h/o catch scratches.  Has had no rashes.  Past Medical History:  Diagnosis Date  . Anxiety   . Conversion disorder   . Post partum depression   . Pseudoseizures   . Syncope    recurrent    Patient Active Problem List   Diagnosis Date Noted  . Conversion reaction 07/15/2016    Past Surgical History:  Procedure Laterality Date  . CHOLECYSTECTOMY    . TUBAL LIGATION      OB History    Gravida Para Term Preterm AB Living   1 1 1          SAB TAB Ectopic  Multiple Live Births                   Home Medications    Prior to Admission medications   Medication Sig Start Date End Date Taking? Authorizing Provider  acetaminophen (TYLENOL) 500 MG tablet Take 1,000 mg by mouth every 6 (six) hours as needed for mild pain or moderate pain.    [provider]  cetirizine (ZYRTEC) 10 MG tablet Take 10 mg by mouth daily.    [provider]  clindamycin (CLEOCIN) 300 MG capsule Take 1 capsule (300 mg total) by mouth 3 (three) times daily for 7 days. 12/02/17 12/09/17  Jaydeen Odor S, PA-C  montelukast (SINGULAIR) 10 MG tablet Take 10 mg by mouth at bedtime.    [provider]  Multiple Vitamins-Minerals (MULTIVITAMINS THER. W/MINERALS) TABS tablet Take 1 tablet by mouth daily.    [provider]  ondansetron (ZOFRAN ODT) 8 MG disintegrating tablet Take 1 tablet (8 mg total) by mouth every 8 (eight) hours as needed for nausea or vomiting. 01/07/17   Evalee Jefferson, PA-C    Family History No family history on file.  Social History Social History   Tobacco Use  . Smoking status: Former Smoker    Packs/day: 0.02    Years: 1.50  Pack years: 0.03    Types: Cigarettes    Last attempt to quit: 12/29/2013    Years since quitting: 3.9  . Smokeless tobacco: Never Used  Substance Use Topics  . Alcohol use: No  . Drug use: No     Allergies   Hydrocodone and Keflex [cephalexin]   Review of Systems Review of Systems  Constitutional: Negative for chills and fever.  HENT: Negative for congestion, ear pain, postnasal drip, rhinorrhea, sinus pressure, sinus pain, sore throat, trouble swallowing and voice change.        Known cavity to right lower molar with no pain.  Swollen lymph nodes to anterior and posterior ear as well as anterior neck.  Eyes: Negative for pain and visual disturbance.  Respiratory: Negative for cough and shortness of breath.   Cardiovascular: Negative for chest pain and palpitations.    Gastrointestinal: Negative for abdominal pain, constipation, diarrhea, nausea and vomiting.  Genitourinary: Negative for dysuria and hematuria.  Musculoskeletal: Negative for arthralgias, back pain and neck stiffness.  Skin: Negative for color change and rash.  Neurological: Negative for seizures and syncope.  All other systems reviewed and are negative.    Physical Exam Updated Vital Signs BP 120/86   Pulse 90   Temp 98.1 F (36.7 C) (Oral)   Resp 16   Ht 5\' 4"  (1.626 m)   Wt 68.9 kg (152 lb)   LMP 11/19/2017   SpO2 100%   BMI 26.09 kg/m   Physical Exam  Constitutional: She appears well-developed and well-nourished. No distress.  HENT:  Head: Normocephalic and atraumatic.  Right Ear: External ear normal.  Left Ear: External ear normal.  Nose: Nose normal.  Mouth/Throat: Oropharynx is clear and moist.  There are no tonsillar exudates or tonsillar swelling.  Uvula midline.  No pharyngeal erythema. No evidence of peritonsillar or retropharyngeal abscess. No hot potato voice.  Bilateral TMs are normal with no erythema, or fluid behind eardrums.  Swelling, tenderness, and shoddiness to anterior and posterior auricular lymph nodes, as well as one anterior cervical node. No evidence of abscesses or cellulitis.  No trismus.  Tooth #32 is fractured with evidence of caries.  Tender to percussion.  No gingival erythema.  No obvious dental abscess.  Eyes: Conjunctivae and EOM are normal. Pupils are equal, round, and reactive to light.  Neck: Normal range of motion. Neck supple. No tracheal deviation present. No thyromegaly present.  Anterior cervical adenopathy on the right with tenderness to palpation.  No nuchal rigidity. FROM of neck and able to flex and extend fully.  Cardiovascular: Normal rate, regular rhythm and normal heart sounds.  No murmur heard. Pulmonary/Chest: Effort normal and breath sounds normal. No stridor. No respiratory distress. She has no wheezes.  Abdominal: Soft.  Bowel sounds are normal. She exhibits no distension. There is no tenderness.  Musculoskeletal: She exhibits no edema.  Neurological: She is alert.  Mental Status:  Alert, thought content appropriate, able to give a coherent history. Speech fluent without evidence of aphasia. Able to follow 2 step commands without difficulty.  Cranial Nerves:  II:  Peripheral visual fields grossly normal, pupils equal, round, reactive to light III,IV, VI: ptosis not present, extra-ocular motions intact bilaterally  V,VII: smile symmetric, facial light touch sensation equal VIII: hearing grossly normal to voice  X: uvula elevates symmetrically  XI: bilateral shoulder shrug symmetric and strong XII: midline tongue extension without fassiculations   Skin: Skin is warm and dry.  Psychiatric: She has a normal mood and  affect.  Nursing note and vitals reviewed.    ED Treatments / Results  Labs (all labs ordered are listed, but only abnormal results are displayed) Labs Reviewed - No data to display  EKG  EKG Interpretation None       Radiology No results found.  Procedures Procedures (including critical care time)  Medications Ordered in ED Medications - No data to display   Initial Impression / Assessment and Plan / ED Course  I have reviewed the triage vital signs and the nursing notes.  Pertinent labs & imaging results that were available during my care of the patient were reviewed by me and considered in my medical decision making (see chart for details).     Final Clinical Impressions(s) / ED Diagnoses   Final diagnoses:  Swollen lymph nodes  Dental caries   22 year old female presenting with 5-day history of anterior/posterior auricular lymphadenopathy as well as anterior cervical lymphadenopathy.  No other associated symptoms except for an intermittent headache that resolves with Tylenol.  Does have a history of headaches and states that these symptoms are mild and consistent with  chronic headaches.  Exam is otherwise completely benign and vital signs are stable.  She is afebrile and is in no acute distress.  Discussed that her lymphadenopathy may be related to a dental infection since she does have known fracture and cavity to the right side where lymphadenopathy is present.  Will discharge home with a course of clindamycin to treat this potential cause.  Have discussed that if symptoms do not resolve after taking the antibiotic that she needs to follow-up with primary care for further evaluation and potential biopsy of lymph nodes.  Discussed that she should return to the ER if she has any new or worsening symptoms that develop.  Patient understands the plan and agrees to follow-up if symptoms not resolved.  Also agrees to return if new or worsening symptoms.  All questions answered.  ED Discharge Orders        Ordered    clindamycin (CLEOCIN) 300 MG capsule  3 times daily     12/02/17 [address], [place], PA-C 12/02/17 1832    Dorie Rank, MD 12/04/17 1943

## 2017-12-31 NOTE — Progress Notes (Signed)
Subjective: [id number] care with new provider.  DMV paperwork PCP: Janora Norlander, DO; former Dr Sabra Heck patient Grace Sheppard is a 22 y.o. female presenting to clinic today for:  1. DMV paperwork/ GAD/ Depression/ Hx conversion disorder. Patient reports that she had onset of pseudoseizure in 2011.  She notes these are manifestations of anxiety and depression.  She was treated with Celexa for many years.  She reports that she was actually able to come off of it within the last year because symptoms had improved and were well controlled.  She has done well until this past fall, when she notes that she started going through divorce and adoption process of her stepchildren.  She does report increased stress, difficulty sleeping.  Denies visual or auditory hallucinations.  No suicidal or homicidal ideation.  No history of suicidal ideation.  No loss of consciousness or seizure like activity in greater than 1 year.  She was previously followed by a psychiatrist/therapist in [place].  She no longer sees them.  2. Thyroid Patient reports that she was told that her thyroid felt abnormal recently in the emergency department.  She notes that she often will have increased energy, insomnia, anxiety as above.  She has a strong family history of thyroid disorder in her mother, father, 2 brothers and 1 sister.  She has not had her thyroid checked.  Denies difficulty swallowing, changes in voice, diarrhea.  She reports heat intolerance.  She reports intermittent palpitations.   ROS: Per HPI  Allergies  Allergen Reactions  . Hydrocodone Rash  . Keflex [Cephalexin] Rash    Rash on chest   Past Medical History:  Diagnosis Date  . Anxiety   . Conversion disorder   . Post partum depression   . Pseudoseizures   . Syncope    recurrent    Current Outpatient Medications:  .  citalopram (CELEXA) 10 MG tablet, Take 1 tablet (10 mg total) by mouth daily., Disp: 30 tablet, Rfl: 1   Social  History   Socioeconomic History  . Marital status: Married    Spouse name: Not on file  . Number of children: Not on file  . Years of education: Not on file  . Highest education level: Not on file  Social Needs  . Financial resource strain: Not on file  . Food insecurity - worry: Not on file  . Food insecurity - inability: Not on file  . Transportation needs - medical: Not on file  . Transportation needs - non-medical: Not on file  Occupational History  . Not on file  Tobacco Use  . Smoking status: Former Smoker    Packs/day: 0.02    Years: 1.50    Pack years: 0.03    Types: Cigarettes    Last attempt to quit: 12/29/2013    Years since quitting: 4.0  . Smokeless tobacco: Never Used  Substance and Sexual Activity  . Alcohol use: No  . Drug use: No  . Sexual activity: Not on file  Other Topics Concern  . Not on file  Social History Narrative  . Not on file   Family History  Problem Relation Age of Onset  . Thyroid disease Mother   . Thyroid disease Father   . Thyroid disease Sister   . Thyroid disease Brother   . Thyroid disease Brother     Objective: Office vital signs reviewed. BP 131/89   Pulse (!) 103   Temp 98.7 F (37.1 C) (Oral)   Ht '5\' 4"'  (  1.626 m)   Wt 147 lb (66.7 kg)   BMI 25.23 kg/m   Physical Examination:  General: Awake, alert, No acute distress HEENT: Normal    Neck: No masses palpated. No lymphadenopathy; thyroid does feel full but no dominant thyroid masses or nodules noted.    Eyes: PERRLA, extraocular membranes intact, sclera white.  No exophthalmos   Throat: moist mucus membranes, no erythema Cardio: regular rate and rhythm, S1S2 heard, no murmurs appreciated Pulm: clear to auscultation bilaterally, no wheezes, rhonchi or rales; normal work of breathing on room air Extremities: warm, well perfused, No edema, cyanosis or clubbing; +2 pulses bilaterally MSK: Normal gait and normal station Skin: dry; intact; no rashes or lesions; normal  temperature Neuro: Follows all commands.  No focal deficits.  No tremor noted. AOx3. Psych: Mood stable, speech normal, affect appropriate, does not appear to be responding to internal stimuli.  Depression screen [facility] 2/9 01/01/2018 07/15/2016 05/05/2016  Decreased Interest 1 0 1  Down, Depressed, Hopeless 1 0 0  PHQ - 2 Score 2 0 1  Altered sleeping 1 - -  Tired, decreased energy 1 - -  Change in appetite 1 - -  Feeling bad or failure about yourself  1 - -  Trouble concentrating 1 - -  Moving slowly or fidgety/restless 1 - -  Suicidal thoughts 0 - -  PHQ-9 Score 8 - -  Difficult doing work/chores Somewhat difficult - -   GAD 7 : Generalized Anxiety Score 01/01/2018  Nervous, Anxious, on Edge 1  Control/stop worrying 2  Worry too much - different things 2  Trouble relaxing 1  Restless 0  Easily annoyed or irritable 0  Afraid - awful might happen 0  Total GAD 7 Score 6    Assessment/ Plan: 22 y.o. female   1. Anxiety and depression Previously controlled with Celexa.  Will initiate this at 10 mg daily.  Patient will follow-up in 4-6 weeks for recheck.  National suicide hotline provided.  DMV paperwork completed.  I do not think that she poses a danger to herself or other persons.  She will be operating a vehicle for personal use only.  May benefit from therapy given acute stressors secondary to divorce and life changes. - CMP14+EGFR - TSH - CBC with Differential  2. Medication monitoring encounter - CMP14+EGFR - CBC with Differential  3. Thyroid fullness Strong family history of thyroid disease.  She does have a full feeling thyroid on today's exam with no dominant masses.  Would consider ultrasound of thyroid going forward.  Will address this at next visit. - TSH  4. Family history of thyroid disease - TSH   Orders Placed This Encounter  Procedures  . CMP14+EGFR  . TSH  . CBC with Differential   Meds ordered this encounter  Medications  . citalopram (CELEXA) 10 MG  tablet    Sig: Take 1 tablet (10 mg total) by mouth daily.    Dispense:  30 tablet    Refill:  [place], DO [place] [phone]

## 2018-01-01 ENCOUNTER — Ambulatory Visit (INDEPENDENT_AMBULATORY_CARE_PROVIDER_SITE_OTHER): Payer: Medicaid Other | Admitting: Family Medicine

## 2018-01-01 ENCOUNTER — Encounter: Payer: Self-pay | Admitting: Family Medicine

## 2018-01-01 VITALS — BP 131/89 | HR 103 | Temp 98.7°F | Ht 64.0 in | Wt 147.0 lb

## 2018-01-01 DIAGNOSIS — Z8349 Family history of other endocrine, nutritional and metabolic diseases: Secondary | ICD-10-CM | POA: Diagnosis not present

## 2018-01-01 DIAGNOSIS — F32A Depression, unspecified: Secondary | ICD-10-CM | POA: Insufficient documentation

## 2018-01-01 DIAGNOSIS — F329 Major depressive disorder, single episode, unspecified: Secondary | ICD-10-CM | POA: Insufficient documentation

## 2018-01-01 DIAGNOSIS — E0789 Other specified disorders of thyroid: Secondary | ICD-10-CM

## 2018-01-01 DIAGNOSIS — Z5181 Encounter for therapeutic drug level monitoring: Secondary | ICD-10-CM

## 2018-01-01 DIAGNOSIS — F419 Anxiety disorder, unspecified: Secondary | ICD-10-CM

## 2018-01-01 MED ORDER — CITALOPRAM HYDROBROMIDE 10 MG PO TABS: 10.0000 mg | ORAL_TABLET | Freq: Every day | ORAL | 1 refills | Status: DC

## 2018-01-01 NOTE — Patient Instructions (Signed)
You had labs performed today.  You will be contacted with the results of the labs once they are available, usually in the next 3 days for routine lab work.  Follow up in 4-6 weeks for depression/ anxiety.  Taking the medicine as directed and not missing any doses is one of the best things you can do to treat your depression.  Here are some things to keep in mind:  1) Side effects (stomach upset, some increased anxiety) may happen before you notice a benefit.  These side effects typically go away over time. 2) Changes to your dose of medicine or a change in medication all together is sometimes necessary 3) Most people need to be on medication at least 12 months 4) Many people will notice an improvement within two weeks but the full effect of the medication can take up to 4-6 weeks 5) Stopping the medication when you start feeling better often results in a return of symptoms 6) Never discontinue your medication without contacting a health care professional first.  Some medications require gradual discontinuation/ taper and can make you sick if you stop them abruptly.  If your symptoms worsen or you have thoughts of suicide/homicide, PLEASE SEEK IMMEDIATE MEDICAL ATTENTION.  You may always call:  National Suicide Hotline: 902-047-5631 [place]: 919 815 0535 Crisis Recovery in [place]: 6107798058   These are available 24 hours a day, 7 days a week.

## 2018-01-02 LAB — CBC WITH DIFFERENTIAL/PLATELET
BASOS ABS: 0 10*3/uL (ref 0.0–0.2)
Basos: 0 %
EOS (ABSOLUTE): 0.1 10*3/uL (ref 0.0–0.4)
Eos: 1 %
Hematocrit: 45.8 % (ref 34.0–46.6)
Hemoglobin: 15.2 g/dL (ref 11.1–15.9)
Immature Grans (Abs): 0 10*3/uL (ref 0.0–0.1)
Immature Granulocytes: 0 %
LYMPHS ABS: 3.1 10*3/uL (ref 0.7–3.1)
Lymphs: 28 %
MCH: 30.4 pg (ref 26.6–33.0)
MCHC: 33.2 g/dL (ref 31.5–35.7)
MCV: 92 fL (ref 79–97)
MONOCYTES: 3 %
MONOS ABS: 0.3 10*3/uL (ref 0.1–0.9)
Neutrophils Absolute: 7.5 10*3/uL — ABNORMAL HIGH (ref 1.4–7.0)
Neutrophils: 68 %
PLATELETS: 372 10*3/uL (ref 150–379)
RBC: 5 x10E6/uL (ref 3.77–5.28)
RDW: 13.1 % (ref 12.3–15.4)
WBC: 11.1 10*3/uL — AB (ref 3.4–10.8)

## 2018-01-02 LAB — CMP14+EGFR
ALK PHOS: 67 IU/L (ref 39–117)
ALT: 15 IU/L (ref 0–32)
AST: 16 IU/L (ref 0–40)
Albumin/Globulin Ratio: 2 (ref 1.2–2.2)
Albumin: 5.4 g/dL (ref 3.5–5.5)
BUN/Creatinine Ratio: 15 (ref 9–23)
BUN: 11 mg/dL (ref 6–20)
Bilirubin Total: 0.2 mg/dL (ref 0.0–1.2)
CHLORIDE: 100 mmol/L (ref 96–106)
CO2: 23 mmol/L (ref 20–29)
CREATININE: 0.72 mg/dL (ref 0.57–1.00)
Calcium: 10.3 mg/dL — ABNORMAL HIGH (ref 8.7–10.2)
GFR calc Af Amer: 136 mL/min/{1.73_m2} (ref >59)
GFR calc non Af Amer: 118 mL/min/{1.73_m2} (ref >59)
GLOBULIN, TOTAL: 2.7 g/dL (ref 1.5–4.5)
GLUCOSE: 84 mg/dL (ref 65–99)
Potassium: 3.9 mmol/L (ref 3.5–5.2)
SODIUM: 143 mmol/L (ref 134–144)
Total Protein: 8.1 g/dL (ref 6.0–8.5)

## 2018-01-02 LAB — TSH: TSH: 1.03 u[IU]/mL (ref 0.450–4.500)

## 2018-03-01 ENCOUNTER — Ambulatory Visit: Payer: Self-pay | Admitting: Family Medicine

## 2018-03-01 NOTE — Progress Notes (Deleted)
Subjective: CC: f/u Depression/ anxiety PCP: Janora Norlander, DO Grace Sheppard is a 22 y.o. female presenting to clinic today for:  1. Depression/ anxiety Patient was seen 1 month ago for [facility] paperwork needed and depression anxiety.  She has been doing well off of Celexa for many years but most recently has noticed increased depressive and anxiety symptoms in the setting of ongoing divorce and attempt at obtaining custody of her stepchildren.  She was restarted on Celexa 10 mg daily at last visit and is here today for follow-up on this.***   ROS: Per HPI  Allergies  Allergen Reactions  . Hydrocodone Rash  . Keflex [Cephalexin] Rash    Rash on chest   Past Medical History:  Diagnosis Date  . Anxiety   . Conversion disorder   . Post partum depression   . Pseudoseizures   . Syncope    recurrent    Current Outpatient Medications:  .  citalopram (CELEXA) 10 MG tablet, Take 1 tablet (10 mg total) by mouth daily., Disp: 30 tablet, Rfl: 1 Social History   Socioeconomic History  . Marital status: Married    Spouse name: Not on file  . Number of children: Not on file  . Years of education: Not on file  . Highest education level: Not on file  Occupational History  . Not on file  Social Needs  . Financial resource strain: Not on file  . Food insecurity:    Worry: Not on file    Inability: Not on file  . Transportation needs:    Medical: Not on file    Non-medical: Not on file  Tobacco Use  . Smoking status: Former Smoker    Packs/day: 0.02    Years: 1.50    Pack years: 0.03    Types: Cigarettes    Last attempt to quit: 12/29/2013    Years since quitting: 4.1  . Smokeless tobacco: Never Used  Substance and Sexual Activity  . Alcohol use: No  . Drug use: No  . Sexual activity: Not on file  Lifestyle  . Physical activity:    Days per week: Not on file    Minutes per session: Not on file  . Stress: Not on file  Relationships  . Social connections:    Talks  on phone: Not on file    Gets together: Not on file    Attends religious service: Not on file    Active member of club or organization: Not on file    Attends meetings of clubs or organizations: Not on file    Relationship status: Not on file  . Intimate partner violence:    Fear of current or ex partner: Not on file    Emotionally abused: Not on file    Physically abused: Not on file    Forced sexual activity: Not on file  Other Topics Concern  . Not on file  Social History Narrative  . Not on file   Family History  Problem Relation Age of Onset  . Thyroid disease Mother   . Thyroid disease Father   . Thyroid disease Sister   . Thyroid disease Brother   . Thyroid disease Brother     Objective: Office vital signs reviewed. There were no vitals taken for this visit.  Physical Examination:  General: Awake, alert, *** nourished, No acute distress HEENT: Normal    Neck: No masses palpated. No lymphadenopathy    Ears: Tympanic membranes intact, normal light reflex, no  erythema, no bulging    Eyes: PERRLA, extraocular membranes intact, sclera ***    Nose: nasal turbinates moist, *** nasal discharge    Throat: moist mucus membranes, no erythema, *** tonsillar exudate.  Airway is patent Cardio: regular rate and rhythm, S1S2 heard, no murmurs appreciated Pulm: clear to auscultation bilaterally, no wheezes, rhonchi or rales; normal work of breathing on room air GI: soft, non-tender, non-distended, bowel sounds present x4, no hepatomegaly, no splenomegaly, no masses GU: external vaginal tissue ***, cervix ***, *** punctate lesions on cervix appreciated, *** discharge from cervical os, *** bleeding, *** cervical motion tenderness, *** abdominal/ adnexal masses Extremities: warm, well perfused, No edema, cyanosis or clubbing; +*** pulses bilaterally MSK: *** gait and *** station Skin: dry; intact; no rashes or lesions Neuro: *** Strength and light touch sensation grossly intact, ***  DTRs ***/4  Assessment/ Plan: 22 y.o. female   ***  No orders of the defined types were placed in this encounter.  No orders of the defined types were placed in this encounter.    Janora Norlander, DO [place] [phone]

## 2018-03-05 ENCOUNTER — Encounter: Payer: Self-pay | Admitting: Family Medicine

## 2018-03-21 ENCOUNTER — Emergency Department (HOSPITAL_COMMUNITY)
Admission: EM | Admit: 2018-03-21 | Discharge: 2018-03-21 | Disposition: A | Discharge status: Home / Self Care | Payer: Medicaid Other | Attending: Emergency Medicine | Admitting: Emergency Medicine

## 2018-03-21 ENCOUNTER — Encounter (HOSPITAL_COMMUNITY): Payer: Self-pay | Admitting: Emergency Medicine

## 2018-03-21 ENCOUNTER — Other Ambulatory Visit: Payer: Self-pay

## 2018-03-21 ENCOUNTER — Emergency Department (HOSPITAL_COMMUNITY): Payer: Medicaid Other

## 2018-03-21 DIAGNOSIS — M542 Cervicalgia: Secondary | ICD-10-CM | POA: Diagnosis not present

## 2018-03-21 DIAGNOSIS — M5441 Lumbago with sciatica, right side: Secondary | ICD-10-CM | POA: Diagnosis present

## 2018-03-21 DIAGNOSIS — Z79899 Other long term (current) drug therapy: Secondary | ICD-10-CM | POA: Insufficient documentation

## 2018-03-21 DIAGNOSIS — Z87891 Personal history of nicotine dependence: Secondary | ICD-10-CM | POA: Diagnosis not present

## 2018-03-21 LAB — POC URINE PREG, ED: Preg Test, Ur: NEGATIVE

## 2018-03-21 MED ORDER — METHOCARBAMOL 500 MG PO TABS: 500.0000 mg | ORAL_TABLET | Freq: Two times a day (BID) | ORAL | 0 refills | Status: DC

## 2018-03-21 MED ORDER — KETOROLAC TROMETHAMINE 30 MG/ML IJ SOLN
30.0000 mg | Freq: Once | INTRAMUSCULAR | Status: AC
  Administered 2018-03-21: 30 mg via INTRAMUSCULAR
  Filled 2018-03-21: qty 1

## 2018-03-21 MED ORDER — LIDOCAINE 5 % EX PTCH: 1.0000 | MEDICATED_PATCH | CUTANEOUS | 0 refills | Status: DC

## 2018-03-21 MED ORDER — NAPROXEN 375 MG PO TABS: 375.0000 mg | ORAL_TABLET | Freq: Two times a day (BID) | ORAL | 0 refills | Status: DC

## 2018-03-21 NOTE — ED Triage Notes (Signed)
Pt states having pain shooting/tingling down right leg. Pt also complaining of shooting pain going up into her neck area on right side. Denies injury.

## 2018-03-21 NOTE — ED Provider Notes (Signed)
[facility] EMERGENCY DEPARTMENT Provider Note   CSN: [id number] Arrival date & time: 03/21/18  1049     History   Chief Complaint Chief Complaint  Patient presents with  . Back Pain    HPI Grace Sheppard is a 22 y.o. female.  HPI 22 year old Caucasian female past medical history significant for anxiety, conversion disorder, pseudoseizures, syncope presents to the emergency department today for evaluation of neck and back pain.  Patient states this been ongoing for the past 4 days.  States it started out intermittently now is becoming constant.  Reports history of same.  She had MRI prior and was unsure if this was related to sciatic nerve pain.  Patient reports the pain is in her lower right back and radiates down her right leg.  She reports intermittent paresthesias.  She states that sometimes his pain will shoot up to her neck.  Also reports giving her headache.  States headache starts the base of her head and moves forward.  Denies acute onset or maximal in onset.  His headache will come and go.  Denies any associated lightheadedness, dizziness, photophobia, syncope.  Patient is been taking Motrin and Tylenol for symptoms only little relief.  Denies any nausea or vomiting.  Patient denies any urinary symptoms.  Denies any history of IV drug use, history of cancer, saddle paresthesias, lower extremity weakness, loss of bowel or bladder, urinary retention.  She is able to ambulate but does cause her pain.  She did not take anything for the pain today prior to arrival.  Denies associated chest pain, shortness of breath or abdominal pain. Past Medical History:  Diagnosis Date  . Anxiety   . Conversion disorder   . Post partum depression   . Pseudoseizures   . Syncope    recurrent    Patient Active Problem List   Diagnosis Date Noted  . Anxiety and depression 01/01/2018  . Conversion reaction 07/15/2016    Past Surgical History:  Procedure Laterality Date  . CHOLECYSTECTOMY    .  TUBAL LIGATION       OB History    Gravida  1   Para  1   Term  1   Preterm      AB      Living        SAB      TAB      Ectopic      Multiple      Live Births               Home Medications    Prior to Admission medications   Medication Sig Start Date End Date Taking? Authorizing Provider  citalopram (CELEXA) 10 MG tablet Take 1 tablet (10 mg total) by mouth daily. 01/01/18   Janora Norlander, DO    Family History Family History  Problem Relation Age of Onset  . Thyroid disease Mother   . Thyroid disease Father   . Thyroid disease Sister   . Thyroid disease Brother   . Thyroid disease Brother     Social History Social History   Tobacco Use  . Smoking status: Former Smoker    Packs/day: 0.02    Years: 1.50    Pack years: 0.03    Types: Cigarettes    Last attempt to quit: 12/29/2013    Years since quitting: 4.2  . Smokeless tobacco: Never Used  Substance Use Topics  . Alcohol use: No  . Drug use: No  Allergies   Hydrocodone and Keflex [cephalexin]   Review of Systems Review of Systems  All other systems reviewed and are negative.    Physical Exam Updated Vital Signs BP (!) 122/93 (BP Location: Right Arm)   Temp 98 F (36.7 C) (Oral)   Resp 18   Ht 5\' 4"  (1.626 m)   Wt 66.7 kg (147 lb)   LMP 02/15/2018   SpO2 100%   BMI 25.23 kg/m   Physical Exam  Constitutional: She is oriented to person, place, and time. She appears well-developed and well-nourished. No distress.  HENT:  Head: Normocephalic and atraumatic.  There is no temporal artery tenderness to palpation.  Eyes: Pupils are equal, round, and reactive to light. Conjunctivae and EOM are normal. Right eye exhibits no discharge. Left eye exhibits no discharge. No scleral icterus.  Neck: Normal range of motion. Neck supple.   c spine midline tenderness. bilateral paraspinal tenderness. No deformities or step offs noted. Full ROM. Supple. No nuchal rigidity.      Pulmonary/Chest: No respiratory distress.  Abdominal: Soft. Bowel sounds are normal. She exhibits no distension. There is no tenderness. There is no rebound and no guarding.  No CVA tenderness.  Musculoskeletal: Normal range of motion.  Positive straight leg raise test on the right with elevation of 20 degrees.  This reproduces pain and paresthesias.  Skin compartments are soft.  DP pulses are 2+ bilaterally.  Brisk cap refill.  Sensation intact.    Neurological: She is alert and oriented to person, place, and time.  The patient is alert, attentive, and oriented x 3. Speech is clear. Cranial nerve II-VII grossly intact. Negative pronator drift. Sensation intact. Strength 5/5 in all extremities. Reflexes 2+ and symmetric at biceps, triceps, knees, and ankles. Rapid alternating movement and fine finger movements intact. Romberg is absent. Posture and gait normal. Normal proprioception.  Normal point discrimination.  Skin: Skin is warm and dry. Capillary refill takes less than 2 seconds. No pallor.  Psychiatric: Her behavior is normal. Judgment and thought content normal.  Nursing note and vitals reviewed.    ED Treatments / Results  Labs (all labs ordered are listed, but only abnormal results are displayed) Labs Reviewed  POC URINE PREG, ED    EKG None  Radiology Dg Cervical Spine Complete  Result Date: 03/21/2018 CLINICAL DATA:  Neck pain EXAM: CERVICAL SPINE - COMPLETE 4+ VIEW COMPARISON:  CT 12/06/2017 FINDINGS: There is no evidence of cervical spine fracture or prevertebral soft tissue swelling. Alignment is normal. No other significant bone abnormalities are identified. IMPRESSION: Negative cervical spine radiographs. Electronically Signed   By: Rolm Baptise M.D.   On: 03/21/2018 14:30   Dg Lumbar Spine Complete  Result Date: 03/21/2018 CLINICAL DATA:  Low back pain with lower extremity numbness. EXAM: LUMBAR SPINE - COMPLETE 4+ VIEW COMPARISON:  Lumbar spine radiograph  05/05/2017 FINDINGS: Five non-rib-bearing lumbar vertebral bodies. No pars interarticularis defect or abnormal alignment. Vertebral body heights and disc spaces are normal. The unfused apophysis at the inferior anterior corner of L5 is unchanged. Facet articulations are normal. IMPRESSION: Normal lumbar spine radiographs. Electronically Signed   By: Ulyses Jarred M.D.   On: 03/21/2018 14:30    Procedures Procedures (including critical care time)  Medications Ordered in ED Medications  ketorolac (TORADOL) 30 MG/ML injection 30 mg (has no administration in time range)     Initial Impression / Assessment and Plan / ED Course  I have reviewed the triage vital signs and the nursing  notes.  Pertinent labs & imaging results that were available during my care of the patient were reviewed by me and considered in my medical decision making (see chart for details).     Patient presents to the ED with vague symptoms of low back pain that radiates to her right leg with associated paresthesias.  She also has neck pain with headache that starts in her occiput and radiates forward.  Patient has no red flag symptoms concerning for ICH, SAH, temporal arteritis, meningitis.  Headache seems consistent with occipital neuralgia.  Pain is reproducible on exam.  Patient does have a positive straight leg raise test in the right seems consistent with sciatic nerve pain.  She is neurovascularly intact.  No focal neuro deficit noted on exam.  X-rays of cervical and lumbar spine are reassuring.  I suspect this musculoskeletal in nature.  Patient treated with Toradol and feels significantly improved.  Patient denies any urinary symptoms.  Low suspicion for UTI or pyelonephritis given that patient is afebrile without any vomiting or CVA tenderness.  Instructed patient to continue symptom medic care at home with anti-inflammatories, muscle relaxers and lidocaine patches.  Will need PCP Ortho follow-up if symptoms persist.  Pt  is hemodynamically stable, in NAD, & able to ambulate in the ED. Evaluation does not show pathology that would require ongoing emergent intervention or inpatient treatment. I explained the diagnosis to the patient. Pain has been managed & has no complaints prior to dc. Pt is comfortable with above plan and is stable for discharge at this time. All questions were answered prior to disposition. Strict return precautions for f/u to the ED were discussed. Encouraged follow up with PCP.     Final Clinical Impressions(s) / ED Diagnoses   Final diagnoses:  Acute right-sided low back pain with right-sided sciatica  Cervical pain    ED Discharge Orders        Ordered    methocarbamol (ROBAXIN) 500 MG tablet  2 times daily     03/21/18 1511    naproxen (NAPROSYN) 375 MG tablet  2 times daily     03/21/18 1511    lidocaine (LIDODERM) 5 %  Every 24 hours     03/21/18 1511       Doristine Devoid, PA-C 03/21/18 1635    Francine Graven, DO 03/23/18 [phone]

## 2018-03-21 NOTE — Discharge Instructions (Addendum)
Workup has been normal. Please take medications as prescribed and instructed.  Please take the Naproxen as prescribed for pain. Do not take any additional NSAIDs including Motrin, Aleve, Ibuprofen, Advil. Please the the robaxin for muscle relaxation. This medication will make you drowsy so avoid situation that could place you in danger.  Lidocaine patches to help with the pain.  Apply heat to the affected area.  Follow-up with your primary care doctor for further possible orthopedic referral if symptoms persist.   SEEK IMMEDIATE MEDICAL ATTENTION IF: New numbness, tingling, weakness, or problem with the use of your arms or legs.  Severe back pain not relieved with medications.  Change in bowel or bladder control.  Urinary retention.  Numbness in your groin.  Increasing pain in any areas of the body (such as chest or abdominal pain).  Shortness of breath, dizziness or fainting.  Nausea (feeling sick to your stomach), vomiting, fever, or sweats.

## 2018-03-21 NOTE — ED Notes (Signed)
Per pt when she walks, especially on right foot, she gets a shooting pain that travels from leg to neck. States this pain has been giving her headaches. Had an MRI some time ago to diagnose sciatica, but was never diagnosed. Has been taking Tylenol and Ibuprofen for pain, but it does not alleviate pain at all. This has been happening since Sunday. Full ROM. Neck is constantly hurting. Ambulatory to room

## 2018-04-09 ENCOUNTER — Encounter: Payer: Self-pay | Admitting: Family Medicine

## 2018-04-09 ENCOUNTER — Other Ambulatory Visit: Payer: Self-pay | Admitting: Family Medicine

## 2018-04-09 MED ORDER — CITALOPRAM HYDROBROMIDE 10 MG PO TABS: 10.0000 mg | ORAL_TABLET | Freq: Every day | ORAL | 1 refills | Status: DC

## 2018-04-13 ENCOUNTER — Encounter: Payer: Self-pay | Admitting: Family Medicine

## 2018-04-17 ENCOUNTER — Ambulatory Visit: Payer: Medicaid Other | Admitting: Family Medicine

## 2018-04-17 ENCOUNTER — Encounter: Payer: Self-pay | Admitting: Family Medicine

## 2018-08-07 ENCOUNTER — Ambulatory Visit: Payer: Medicaid Other | Admitting: Family Medicine

## 2018-08-15 ENCOUNTER — Ambulatory Visit (INDEPENDENT_AMBULATORY_CARE_PROVIDER_SITE_OTHER): Payer: Self-pay | Admitting: Family Medicine

## 2018-08-15 VITALS — BP 124/88 | HR 90 | Temp 98.7°F | Ht 64.0 in | Wt 151.0 lb

## 2018-08-15 DIAGNOSIS — G44209 Tension-type headache, unspecified, not intractable: Secondary | ICD-10-CM

## 2018-08-15 DIAGNOSIS — F419 Anxiety disorder, unspecified: Secondary | ICD-10-CM

## 2018-08-15 DIAGNOSIS — F329 Major depressive disorder, single episode, unspecified: Secondary | ICD-10-CM

## 2018-08-15 MED ORDER — FLUOXETINE HCL 10 MG PO TABS: ORAL_TABLET | ORAL | 1 refills | Status: DC

## 2018-08-15 MED ORDER — NAPROXEN 500 MG PO TABS: 500.0000 mg | ORAL_TABLET | Freq: Two times a day (BID) | ORAL | 0 refills | Status: DC

## 2018-08-15 NOTE — Patient Instructions (Signed)
For your anxiety and depression, I have prescribed you fluoxetine.  Start with 1 tablet daily.  Take this for 4 weeks.  If your symptoms do not significantly improve in 4 weeks, you can increase to 2 tablets daily.  Touch base with me with regards to how you are doing on this medicine.  I have also prescribed you Naprosyn 500 mg to use up to twice daily if needed for severe headache.  Please only use this as needed as directed.  Take it with food.  Do not use other NSAID medications, including Aleve, Advil, Motrin, etc.  The Naprosyn is not intended for daily use.  It can interact with the fluoxetine I am putting you on for the anxiety and possibly caused a GI bleed.  Use this very carefully.   Living With Anxiety After being diagnosed with an anxiety disorder, you may be relieved to know why you have felt or behaved a certain way. It is natural to also feel overwhelmed about the treatment ahead and what it will mean for your life. With care and support, you can manage this condition and recover from it. How to cope with anxiety Dealing with stress Stress is your body's reaction to life changes and events, both good and bad. Stress can last just a few hours or it can be ongoing. Stress can play a major role in anxiety, so it is important to learn both how to cope with stress and how to think about it differently. Talk with your health care provider or a counselor to learn more about stress reduction. He or she may suggest some stress reduction techniques, such as:  Music therapy. This can include creating or listening to music that you enjoy and that inspires you.  Mindfulness-based meditation. This involves being aware of your normal breaths, rather than trying to control your breathing. It can be done while sitting or walking.  Centering prayer. This is a kind of meditation that involves focusing on a word, phrase, or sacred image that is meaningful to you and that brings you peace.  Deep  breathing. To do this, expand your stomach and inhale slowly through your nose. Hold your breath for 3-5 seconds. Then exhale slowly, allowing your stomach muscles to relax.  Self-talk. This is a skill where you identify thought patterns that lead to anxiety reactions and correct those thoughts.  Muscle relaxation. This involves tensing muscles then relaxing them.  Choose a stress reduction technique that fits your lifestyle and personality. Stress reduction techniques take time and practice. Set aside 5-15 minutes a day to do them. Therapists can offer training in these techniques. The training may be covered by some insurance plans. Other things you can do to manage stress include:  Keeping a stress diary. This can help you learn what triggers your stress and ways to control your response.  Thinking about how you respond to certain situations. You may not be able to control everything, but you can control your reaction.  Making time for activities that help you relax, and not feeling guilty about spending your time in this way.  Therapy combined with coping and stress-reduction skills provides the best chance for successful treatment. Medicines Medicines can help ease symptoms. Medicines for anxiety include:  Anti-anxiety drugs.  Antidepressants.  Beta-blockers.  Medicines may be used as the main treatment for anxiety disorder, along with therapy, or if other treatments are not working. Medicines should be prescribed by a health care provider. Relationships Relationships can play a  big part in helping you recover. Try to spend more time connecting with trusted friends and family members. Consider going to couples counseling, taking family education classes, or going to family therapy. Therapy can help you and others better understand the condition. How to recognize changes in your condition Everyone has a different response to treatment for anxiety. Recovery from anxiety happens when  symptoms decrease and stop interfering with your daily activities at home or work. This may mean that you will start to:  Have better concentration and focus.  Sleep better.  Be less irritable.  Have more energy.  Have improved memory.  It is important to recognize when your condition is getting worse. Contact your health care provider if your symptoms interfere with home or work and you do not feel like your condition is improving. Where to find help and support: You can get help and support from these sources:  Self-help groups.  Online and [employer].  A trusted spiritual leader.  Couples counseling.  Family education classes.  Family therapy.  Follow these instructions at home:  Eat a healthy diet that includes plenty of vegetables, fruits, whole grains, low-fat dairy products, and lean protein. Do not eat a lot of foods that are high in solid fats, added sugars, or salt.  Exercise. Most adults should do the following: ? Exercise for at least 150 minutes each week. The exercise should increase your heart rate and make you sweat (moderate-intensity exercise). ? Strengthening exercises at least twice a week.  Cut down on caffeine, tobacco, alcohol, and other potentially harmful substances.  Get the right amount and quality of sleep. Most adults need 7-9 hours of sleep each night.  Make choices that simplify your life.  Take over-the-counter and prescription medicines only as told by your health care provider.  Avoid caffeine, alcohol, and certain over-the-counter cold medicines. These may make you feel worse. Ask your pharmacist which medicines to avoid.  Keep all follow-up visits as told by your health care provider. This is important. Questions to ask your health care provider  Would I benefit from therapy?  How often should I follow up with a health care provider?  How long do I need to take medicine?  Are there any long-term side effects of my  medicine?  Are there any alternatives to taking medicine? Contact a health care provider if:  You have a hard time staying focused or finishing daily tasks.  You spend many hours a day feeling worried about everyday life.  You become exhausted by worry.  You start to have headaches, feel tense, or have nausea.  You urinate more than normal.  You have diarrhea. Get help right away if:  You have a racing heart and shortness of breath.  You have thoughts of hurting yourself or others. If you ever feel like you may hurt yourself or others, or have thoughts about taking your own life, get help right away. You can go to your nearest emergency department or call:  Your local emergency services (911 in the [place]  A suicide crisis helpline, such as the [place] at 915-422-0576. This is open 24-hours a day.  Summary  Taking steps to deal with stress can help calm you.  Medicines cannot cure anxiety disorders, but they can help ease symptoms.  Family, friends, and partners can play a big part in helping you recover from an anxiety disorder. This information is not intended to replace advice given to you by your health  care provider. Make sure you discuss any questions you have with your health care provider. Document Released: 10/11/2016 Document Revised: 10/11/2016 Document Reviewed: 10/11/2016 Elsevier Interactive Patient Education  [employer].

## 2018-08-15 NOTE — Progress Notes (Signed)
Subjective: CC: GAD/Depression PCP: Janora Norlander, DO; former Dr Sabra Heck patient Grace Sheppard is a 22 y.o. female presenting to clinic today for:  1. GAD/ Depression/ Hx conversion disorder. History: Had pseudoseizure in 2011.  She was treated with Celexa for many years.  She was previously followed by a psychiatrist/therapist in [place].  She no longer sees them.  Patient last seen in March and restarted on her Celexa 10 mg.  She was supposed to return in 6 weeks but states that she had a lot going on and was unable to return due to lack of insurance.  She had significant dizziness with starting the Celexa and therefore it self discontinued the medication.  She did not feel that it helped with her mood or depressive symptoms.  She has had quite a bit of change within the family since her last visit and states that she has formally separated from her ex-husband and they are going through custody issues now.  She reports severe anxiety surrounding sharing custody with her husband and states that this impacts her sleep quite a bit.  She feels overwhelmed.  She is now estranged from the 2 stepchildren that she had but states that she had been very close to and raised from birth.  She is not able to see these children since they are not her biologic children.  She wants to go back on medication.  2.  Headaches Patient reports that she has been having intermittent headaches that can last up to a day.  She describes them as starting at the back of her head and wrapping around to both temples.  Sometimes she wakes up with them.  She denies any nausea, vomiting, visual disturbance, balance problems, numbness, tingling or weakness.  She does have photophobia and phonophobia associated with them.  She went to urgent care recently and was given Imitrex which did seem to help.  However she notes that she is uninsured and does not feel that she can afford this.  Symptoms are refractory to OTC  Excedrin Migraine.  ROS: Per HPI  Allergies  Allergen Reactions  . Hydrocodone Rash  . Keflex [Cephalexin] Rash    Rash on chest   Past Medical History:  Diagnosis Date  . Anxiety   . Conversion disorder   . Post partum depression   . Pseudoseizures   . Syncope    recurrent   No current outpatient medications on file.   Social History   Socioeconomic History  . Marital status: Legally Separated    Spouse name: Not on file  . Number of children: Not on file  . Years of education: Not on file  . Highest education level: Not on file  Occupational History  . Not on file  Social Needs  . Financial resource strain: Not on file  . Food insecurity:    Worry: Not on file    Inability: Not on file  . Transportation needs:    Medical: Not on file    Non-medical: Not on file  Tobacco Use  . Smoking status: Former Smoker    Packs/day: 0.02    Years: 1.50    Pack years: 0.03    Types: Cigarettes    Last attempt to quit: 12/29/2013    Years since quitting: 4.6  . Smokeless tobacco: Never Used  Substance and Sexual Activity  . Alcohol use: No  . Drug use: No  . Sexual activity: Not on file  Lifestyle  . Physical activity:  Days per week: Not on file    Minutes per session: Not on file  . Stress: Not on file  Relationships  . Social connections:    Talks on phone: Not on file    Gets together: Not on file    Attends religious service: Not on file    Active member of club or organization: Not on file    Attends meetings of clubs or organizations: Not on file    Relationship status: Not on file  . Intimate partner violence:    Fear of current or ex partner: Not on file    Emotionally abused: Not on file    Physically abused: Not on file    Forced sexual activity: Not on file  Other Topics Concern  . Not on file  Social History Narrative  . Not on file   Family History  Problem Relation Age of Onset  . Thyroid disease Mother   . Thyroid disease Father   .  Thyroid disease Sister   . Thyroid disease Brother   . Thyroid disease Brother     Objective: Office vital signs reviewed. BP 124/88   Pulse 90   Temp 98.7 F (37.1 C) (Oral)   Ht 5\' 4"  (1.626 m)   Wt 151 lb (68.5 kg)   BMI 25.92 kg/m   Physical Examination:  General: Awake, alert, No acute distress HEENT: Normal    Eyes: PERRLA, extraocular membranes intact, sclera white.     Throat: moist mucus membranes, symmetric rise of palate noted, no erythema Extremities: warm, well perfused, No edema, cyanosis or clubbing; +2 pulses bilaterally MSK: Normal gait and normal station Neuro: Follows all commands.  No focal deficits.  AOx3. Psych: Mood stable, speech normal, affect appropriate, good eye contact.  Depression screen [facility] 2/9 08/15/2018 01/01/2018 07/15/2016  Decreased Interest 1 1 0  Down, Depressed, Hopeless 1 1 0  PHQ - 2 Score 2 2 0  Altered sleeping 2 1 -  Tired, decreased energy 3 1 -  Change in appetite 2 1 -  Feeling bad or failure about yourself  1 1 -  Trouble concentrating 1 1 -  Moving slowly or fidgety/restless 0 1 -  Suicidal thoughts 0 0 -  PHQ-9 Score 11 8 -  Difficult doing work/chores Somewhat difficult Somewhat difficult -   GAD 7 : Generalized Anxiety Score 08/15/2018 01/01/2018  Nervous, Anxious, on Edge 1 1  Control/stop worrying 2 2  Worry too much - different things 2 2  Trouble relaxing 2 1  Restless 1 0  Easily annoyed or irritable 2 0  Afraid - awful might happen 1 0  Total GAD 7 Score 11 6  Anxiety Difficulty Somewhat difficult -    Assessment/ Plan: 22 y.o. female   1. Anxiety and depression Start Prozac 10 mg daily.  Okay to continue for 4 weeks.  If no improvement in symptoms can increase to 20 mg daily.  We discussed the potential side effects of the medication.  Patient voiced good understanding.  Reasons for return and emergent evaluation discussed with the patient.  She will contact me via my chart if she is doing well and I will  extend her refills.  If she is not doing well on the medication, she will return for face-to-face visit.  2. Acute non intractable tension-type headache Difficult to tell if this is a tension type versus migraine headache.  Her neurologic exam was completely unremarkable.  We discussed hydration and adequate sleep.  I agree that Imitrex would be very unaffordable for this patient who is uninsured.  I have prescribed her Naprosyn 500 mg to use twice daily as needed severe headache.  We discussed the potential side effect of the NSAID with the SSRI and patient will use this very sparingly.  She understands reasons for emergent evaluation.  She will follow-up PRN.   Meds ordered this encounter  Medications  . naproxen (NAPROSYN) 500 MG tablet    Sig: Take 1 tablet (500 mg total) by mouth 2 (two) times daily with a meal. (if needed for a headache)    Dispense:  30 tablet    Refill:  0  . FLUoxetine (PROZAC) 10 MG tablet    Sig: Take 1 tablet daily x4 weeks.  If no improvement in symptoms, can increase to 2 tablets daily.    Dispense:  60 tablet    Refill:  [place], DO [place] [phone]

## 2018-08-16 ENCOUNTER — Encounter: Payer: Self-pay | Admitting: Family Medicine

## 2018-10-10 ENCOUNTER — Ambulatory Visit: Payer: Self-pay | Admitting: Family Medicine

## 2018-10-16 ENCOUNTER — Encounter: Payer: Self-pay | Admitting: Family Medicine

## 2019-03-11 ENCOUNTER — Ambulatory Visit (INDEPENDENT_AMBULATORY_CARE_PROVIDER_SITE_OTHER): Payer: Medicaid Other | Admitting: Otolaryngology

## 2019-04-11 DIAGNOSIS — J4531 Mild persistent asthma with (acute) exacerbation: Secondary | ICD-10-CM | POA: Insufficient documentation

## 2019-05-09 DIAGNOSIS — C679 Malignant neoplasm of bladder, unspecified: Secondary | ICD-10-CM | POA: Insufficient documentation

## 2020-01-14 ENCOUNTER — Other Ambulatory Visit: Payer: Self-pay

## 2020-01-14 ENCOUNTER — Encounter: Payer: Self-pay | Admitting: Physical Therapy

## 2020-01-14 ENCOUNTER — Ambulatory Visit: Payer: Medicaid Other | Attending: Family Medicine | Admitting: Physical Therapy

## 2020-01-14 DIAGNOSIS — R293 Abnormal posture: Secondary | ICD-10-CM | POA: Insufficient documentation

## 2020-01-14 NOTE — Therapy (Signed)
[facility] [place], [place], [address] Phone: [phone]   Fax:  [phone]  Physical Therapy Evaluation  Patient Details  Name: Grace Sheppard MRN: [id number] Date of Birth: May 03, 1996 Referring Provider (PT): Irene Pap, DO   Encounter Date: 01/14/2020  PT End of Session - 01/14/20 1113    Visit Number  1    Number of Visits  12    Date for PT Re-Evaluation  02/25/20    Authorization Type  Medicaid    PT Start Time  1030    PT Stop Time  1105    PT Time Calculation (min)  35 min    Activity Tolerance  Patient limited by pain    Behavior During Therapy  [facility] for tasks assessed/performed       Past Medical History:  Diagnosis Date  . Anxiety   . Conversion disorder   . Post partum depression   . Pseudoseizures   . Syncope    recurrent    Past Surgical History:  Procedure Laterality Date  . CHOLECYSTECTOMY    . TUBAL LIGATION      There were no vitals filed for this visit.   Subjective Assessment - 01/14/20 1029    Subjective  COVID-19 screening performed upon arrival.Patient arrives to physical therapy with reports of lower abdominal pain that began after lifting heavy objects at work in February 2021. Patient reports CT ruled out hernia and reports pain is likely due to an abdominal muscle strain. Patient reports pain with walking, coming to standing, picking up objects, and leaning forward for lower body dressing. Patient reports pain at worst as 8/10 and pain at best is 3/10. Patient's goals are to decrease pain, improve movement, and improve ability to perform home and work activities.    Pertinent History  history of tubal ligation and cholecystectomy 2017, Asthma, Anxiety, conversion disorder, pseudoseizures.    Limitations  Walking;Standing;House hold activities    How long can you stand comfortably?  15-20 mins    How long can you walk comfortably?  15-20 mins    Diagnostic tests  CT scan and X-Ray     Patient Stated Goals  decrease pain, get back to work without pain    Currently in Pain?  Yes    Pain Score  5     Pain Location  Abdomen    Pain Orientation  Lower    Pain Descriptors / Indicators  Sharp    Pain Type  Acute pain    Pain Onset  More than a month ago    Pain Frequency  Constant    Aggravating Factors   lifting, pulling, walking, squatting    Pain Relieving Factors  sitting up with pillow, bracing for movement    Effect of Pain on Daily Activities  hard to get up and down, out of work until 01/24/2020         [facility] PT Assessment - 01/14/20 0001      Assessment   Medical Diagnosis  lower abdominal pain    Referring Provider (PT)  Irene Pap, DO    Onset Date/Surgical Date  12/13/19    Next MD Visit  n/a    Prior Therapy  n      Precautions   Precautions  Other (comment)    Precaution Comments  no lifting greater than 10 lbs      Restrictions   Weight Bearing Restrictions  No      Balance Screen  Has the patient fallen in the past 6 months  Yes    How many times?  2    Has the patient had a decrease in activity level because of a fear of falling?   No    Is the patient reluctant to leave their home because of a fear of falling?   No      Home Environment   Living Environment  Private residence      Prior Function   Level of Independence  Independent with basic ADLs      Observation/Other Assessments   Observations  sits in guarded position with hand on stomach      Posture/Postural Control   Posture/Postural Control  Postural limitations    Postural Limitations  Rounded Shoulders;Forward head      ROM / Strength   AROM / PROM / Strength  Strength      Strength   Strength Assessment Site  Lumbar;Hip;Knee    Right/Left Hip  Right;Left    Right Hip Flexion  3-/5    Right Hip Extension  3-/5    Right Hip ABduction  3+/5    Left Hip Flexion  3-/5    Left Hip Extension  3-/5    Left Hip ABduction  3+/5    Right/Left Knee  Right;Left    Right  Knee Flexion  3+/5    Right Knee Extension  3+/5    Left Knee Flexion  3+/5    Left Knee Extension  3+/5    Lumbar Flexion  2/5      Palpation   Palpation comment  very tender to palpation to lower abdominals and obliques      Transfers   Comments  braces abdominals with transitional movements.      Ambulation/Gait   Gait Pattern  Step-through pattern;Antalgic                Objective measurements completed on examination: See above findings.              PT Education - 01/14/20 1112    Education Details  draw in, hip adduction isometric, lower trunk rotation, bed mobility with pillow bracing    Person(s) Educated  Patient    Methods  Explanation;Demonstration;Handout    Comprehension  Verbalized understanding       PT Short Term Goals - 01/14/20 1113      PT SHORT TERM GOAL #1   Title  Patient will be independent with HEP    Baseline  no knowledge of HEP    Time  3    Period  Weeks    Status  New      PT SHORT TERM GOAL #2   Title  Patient will demonstrate proper log roll techinque to protect abdominals during transfers    Baseline  long sit to supine transition    Time  3    Period  Weeks    Status  New        PT Long Term Goals - 01/14/20 1114      PT LONG TERM GOAL #1   Title  Patient will be independent with advanced HEP    Baseline  no knowledge of HEP    Time  6    Period  Weeks    Status  New      PT LONG TERM GOAL #2   Title  Patient will demonstrate 4/5 or greater bilateral LE MMT to improve stability during functional tasks.  Baseline  3+/5 throughout bilateral LEs    Time  4    Period  Weeks    Status  New      PT LONG TERM GOAL #3   Title  Patient will demonstrate 4/5 abdominal MMT to improve strength for home and work activities.    Baseline  2/5, able to lift head off table unsupported    Time  6    Period  Weeks    Status  New      PT LONG TERM GOAL #4   Title  Patient will report ability to perform ADLs, home  and work activities with abdominal pain less than or equal to 4/10    Baseline  8/10 at worst with ADLs and lifting activities for work.    Time  6    Period  Weeks    Status  New             Plan - 01/14/20 1308    Clinical Impression Statement  Patient is a 24 year old female who presents to physical therapy with lower abdominal pain with movement, decreased bilateral LE MMT, and tenderness to lower abdominals upon palpation. Patient requires UE support for sit <> stand transfers, and braces lower abdominals for bed mobility and supine <> sit transfers. Patient and PT discussed plan of care and HEP to address deficits. Patient reported understanding. Patient would benefit from skilled physical therapy to address deficits and address patient's goals.    Personal Factors and Comorbidities  Comorbidity 2    Examination-Activity Limitations  Bend;Bathing;Transfers;Locomotion Level;Reach Overhead;Dressing;Hygiene/Grooming;Stand    Examination-Participation Restrictions  Cleaning;Laundry    Stability/Clinical Decision Making  Stable/Uncomplicated    Clinical Decision Making  Low    Rehab Potential  Fair    PT Frequency  2x / week    PT Duration  6 weeks    PT Treatment/Interventions  ADLs/Self Care Home Management;Moist Heat;Therapeutic exercise;Neuromuscular re-education;Manual techniques;Passive range of motion;Therapeutic activities;Functional mobility training;Patient/family education    PT Next Visit Plan  nustep, UBE, postural TEs, core stability and strengthening    PT Home Exercise Plan  see patient education section    Consulted and Agree with Plan of Care  Patient       Patient will benefit from skilled therapeutic intervention in order to improve the following deficits and impairments:  Decreased activity tolerance, Decreased strength, Difficulty walking, Pain, Postural dysfunction  Visit Diagnosis: Abnormal posture     Problem List Patient Active Problem List    Diagnosis Date Noted  . Anxiety and depression 01/01/2018  . Conversion reaction 07/15/2016    Gabriela Eves, PT, DPT 01/14/2020, 6:24 PM  [facility] [address] [place], [place], [address] Phone: [phone]   Fax:  [phone]  Name: CNIYAH FITT MRN: [id number] Date of Birth: 1996-04-18

## 2020-07-16 ENCOUNTER — Ambulatory Visit: Payer: Medicaid Other | Admitting: Podiatry

## 2020-07-20 ENCOUNTER — Ambulatory Visit: Payer: Medicaid Other | Admitting: Podiatry

## 2020-07-21 ENCOUNTER — Ambulatory Visit (INDEPENDENT_AMBULATORY_CARE_PROVIDER_SITE_OTHER): Payer: 59 | Admitting: Podiatry

## 2020-07-21 ENCOUNTER — Other Ambulatory Visit: Payer: Self-pay

## 2020-07-21 ENCOUNTER — Ambulatory Visit (INDEPENDENT_AMBULATORY_CARE_PROVIDER_SITE_OTHER): Payer: 59

## 2020-07-21 ENCOUNTER — Encounter: Payer: Self-pay | Admitting: Podiatry

## 2020-07-21 DIAGNOSIS — M722 Plantar fascial fibromatosis: Secondary | ICD-10-CM

## 2020-07-21 MED ORDER — METHYLPREDNISOLONE 4 MG PO TBPK: ORAL_TABLET | ORAL | 0 refills | Status: DC

## 2020-07-21 MED ORDER — MELOXICAM 15 MG PO TABS: 15.0000 mg | ORAL_TABLET | Freq: Every day | ORAL | 3 refills | Status: DC

## 2020-07-21 NOTE — Patient Instructions (Signed)

## 2020-07-23 NOTE — Progress Notes (Signed)
Subjective:  Patient ID: Grace Sheppard, female    DOB: 1996-09-16,  MRN: [id number] HPI Chief Complaint  Patient presents with  . Foot Pain    Plantar arch and heel bilateral - aching x 2 years intermittent, AM pain, tried OTC insoles and new shoes (shoes did help)  . New Patient (Initial Visit)    24 y.o. female presents with the above complaint.   ROS: Denies fever chills nausea vomiting muscle aches pains calf pain back pain chest pain shortness of breath.  Past Medical History:  Diagnosis Date  . Anxiety   . Conversion disorder   . Post partum depression   . Pseudoseizures   . Syncope    recurrent   Past Surgical History:  Procedure Laterality Date  . CHOLECYSTECTOMY    . TUBAL LIGATION      Current Outpatient Medications:  .  buPROPion (WELLBUTRIN XL) 150 MG 24 hr tablet, Take 150 mg by mouth every morning., Disp: , Rfl:  .  butalbital-acetaminophen-caffeine (FIORICET) 50-325-40 MG tablet, Take by mouth every 4 (four) hours as needed., Disp: , Rfl:  .  FLOVENT HFA 44 MCG/ACT inhaler, Inhale 2 puffs into the lungs 2 (two) times daily., Disp: , Rfl:  .  fluticasone (FLONASE) 50 MCG/ACT nasal spray, Place 2 sprays into both nostrils daily., Disp: , Rfl:  .  LINZESS 145 MCG CAPS capsule, Take 145 mcg by mouth daily., Disp: , Rfl:  .  meloxicam (MOBIC) 15 MG tablet, Take 1 tablet (15 mg total) by mouth daily., Disp: 30 tablet, Rfl: 3 .  methylPREDNISolone (MEDROL DOSEPAK) 4 MG TBPK tablet, 6 day dose pack - take as directed, Disp: 21 tablet, Rfl: 0 .  phenazopyridine (PYRIDIUM) 200 MG tablet, Take 200 mg by mouth 3 (three) times daily as needed., Disp: , Rfl:  .  rizatriptan (MAXALT) 5 MG tablet, SMARTSIG:1 Tablet(s) By Mouth 1 to 2 Times Daily, Disp: , Rfl:   Allergies  Allergen Reactions  . Amoxicillin Rash  . Hydrocodone Rash  . Keflex [Cephalexin] Rash    Rash on chest  . Penicillin G Rash   Review of Systems Objective:  There were no vitals filed for this  visit.  General: Well developed, nourished, in no acute distress, alert and oriented x3   Dermatological: Skin is warm, dry and supple bilateral. Nails x 10 are well maintained; remaining integument appears unremarkable at this time. There are no open sores, no preulcerative lesions, no rash or signs of infection present.  Vascular: Dorsalis Pedis artery and Posterior Tibial artery pedal pulses are 2/4 bilateral with immedate capillary fill time. Pedal hair growth present. No varicosities and no lower extremity edema present bilateral.   Neruologic: Grossly intact via light touch bilateral. Vibratory intact via tuning fork bilateral. Protective threshold with Semmes Wienstein monofilament intact to all pedal sites bilateral. Patellar and Achilles deep tendon reflexes 2+ bilateral. No Babinski or clonus noted bilateral.   Musculoskeletal: No gross boney pedal deformities bilateral. No pain, crepitus, or limitation noted with foot and ankle range of motion bilateral. Muscular strength 5/5 in all groups tested bilateral.  Pain on palpation medial calcaneal tubercles bilateral flexible foot deformity.  Gait: Unassisted, Nonantalgic.    Radiographs:  Radiographs taken today demonstrate soft tissue increase in density plantar calcaneal insertion sites bilaterally.  No significant osseous abnormalities.  Assessment & Plan:   Assessment: Plantar fasciitis bilateral.  Plan: Discussed etiology pathology conservative surgical therapy start her on methylprednisolone to be followed by meloxicam.  Injected  the bilateral heels today with 20 mg Kenalog 5 mg Marcaine.  Discussed appropriate shoe gear stretching exercise ice therapy sugar modifications and will like to follow-up with her in 1 month     Lailanie Hasley T. [place], [place]

## 2020-08-27 ENCOUNTER — Ambulatory Visit: Payer: 59 | Admitting: Podiatry

## 2020-12-21 ENCOUNTER — Other Ambulatory Visit: Payer: Self-pay

## 2020-12-21 ENCOUNTER — Ambulatory Visit (INDEPENDENT_AMBULATORY_CARE_PROVIDER_SITE_OTHER): Payer: Medicaid Other | Admitting: Podiatry

## 2020-12-21 DIAGNOSIS — M722 Plantar fascial fibromatosis: Secondary | ICD-10-CM

## 2020-12-21 MED ORDER — BETAMETHASONE SOD PHOS & ACET 6 (3-3) MG/ML IJ SUSP: 3.0000 mg | Freq: Once | INTRAMUSCULAR | Status: DC

## 2020-12-21 MED ORDER — METHYLPREDNISOLONE 4 MG PO TBPK: ORAL_TABLET | ORAL | 0 refills | Status: DC

## 2020-12-21 MED ORDER — MELOXICAM 15 MG PO TABS: 15.0000 mg | ORAL_TABLET | Freq: Every day | ORAL | 1 refills | Status: AC

## 2020-12-21 NOTE — Progress Notes (Signed)
   Subjective: 25 y.o. female presenting for follow-up evaluation of bilateral heel pain. The patient was last seen in the office September 2021. At that time Dr. Milinda Pointer administered steroid oral cortisone injections which helped significantly for about 3 months. The pain slowly recurred. She continues to take meloxicam as needed. She presents for further treatment and evaluation   Past Medical History:  Diagnosis Date  . Anxiety   . Conversion disorder   . Post partum depression   . Pseudoseizures   . Syncope    recurrent     Objective: Physical Exam General: The patient is alert and oriented x3 in no acute distress.  Dermatology: Skin is warm, dry and supple bilateral lower extremities. Negative for open lesions or macerations bilateral.   Vascular: Dorsalis Pedis and Posterior Tibial pulses palpable bilateral.  Capillary fill time is immediate to all digits.  Neurological: Epicritic and protective threshold intact bilateral.   Musculoskeletal: Tenderness to palpation to the plantar aspect of the bilateral heels along the plantar fascia. All other joints range of motion within normal limits bilateral. Strength 5/5 in all groups bilateral.   Radiographic exam: Normal osseous mineralization. Joint spaces preserved. No fracture/dislocation/boney destruction. No other soft tissue abnormalities or radiopaque foreign bodies.   Assessment: 1. plantar fasciitis bilateral feet  Plan of Care:  1. Patient evaluated. Xrays reviewed.   2. Injection of 0.5cc Celestone soluspan injected into the bilateral heels.  3. Rx for Medrol Dose Pak placed 4. Rx for Meloxicam ordered for patient. 5. OTC power step insoles provided today. Wear daily 6. Instructed patient regarding therapies and modalities at home to alleviate symptoms.  7. Return to clinic in 4 weeks.    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 Edrick Kins,  DPM    [address], [place] [address]                Office [phone]  Fax [phone]

## 2021-01-18 ENCOUNTER — Other Ambulatory Visit: Payer: Self-pay

## 2021-01-18 ENCOUNTER — Ambulatory Visit: Payer: Medicaid Other | Admitting: Podiatry

## 2021-01-18 DIAGNOSIS — M722 Plantar fascial fibromatosis: Secondary | ICD-10-CM | POA: Diagnosis not present

## 2021-01-18 MED ORDER — BETAMETHASONE SOD PHOS & ACET 6 (3-3) MG/ML IJ SUSP
3.0000 mg | Freq: Once | INTRAMUSCULAR | Status: AC
  Administered 2021-01-18: 3 mg via INTRA_ARTICULAR

## 2021-01-18 NOTE — Progress Notes (Signed)
   Subjective: 25 y.o. female presenting for follow-up evaluation of bilateral heel pain.  Patient states that overall she is feeling significantly better.  She is taking the meloxicam as prescribed.  She also completed the Medrol Dosepak.  She wears the OTC power step insoles at work daily.  She presents for further treatment evaluation   Past Medical History:  Diagnosis Date  . Anxiety   . Conversion disorder   . Post partum depression   . Pseudoseizures   . Syncope    recurrent     Objective: Physical Exam General: The patient is alert and oriented x3 in no acute distress.  Dermatology: Skin is warm, dry and supple bilateral lower extremities. Negative for open lesions or macerations bilateral.   Vascular: Dorsalis Pedis and Posterior Tibial pulses palpable bilateral.  Capillary fill time is immediate to all digits.  Neurological: Epicritic and protective threshold intact bilateral.   Musculoskeletal: Tenderness to palpation to the plantar aspect of the bilateral heels along the plantar fascia. All other joints range of motion within normal limits bilateral. Strength 5/5 in all groups bilateral.   Assessment: 1. plantar fasciitis bilateral feet; improved  Plan of Care:  1. Patient evaluated. Xrays reviewed.   2. Injection of 0.5cc Celestone soluspan injected into the bilateral heels.  3.  Continue meloxicam daily as needed 4.  Continue OTC power step insoles daily 5.  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 Edrick Kins, DPM    [address], [place] [address]                Office [phone]  Fax [phone]

## 2021-04-12 ENCOUNTER — Encounter: Payer: Self-pay | Admitting: Podiatry

## 2021-05-14 ENCOUNTER — Other Ambulatory Visit: Payer: Self-pay | Admitting: Podiatry

## 2021-05-14 NOTE — Telephone Encounter (Signed)
Please advise 

## 2021-09-09 ENCOUNTER — Other Ambulatory Visit: Payer: Self-pay

## 2021-09-09 ENCOUNTER — Ambulatory Visit (HOSPITAL_BASED_OUTPATIENT_CLINIC_OR_DEPARTMENT_OTHER)
Admission: RE | Admit: 2021-09-09 | Discharge: 2021-09-09 | Disposition: A | Discharge status: Home / Self Care | Payer: Medicaid Other | Source: Ambulatory Visit | Attending: Family Medicine | Admitting: Family Medicine

## 2021-09-09 ENCOUNTER — Other Ambulatory Visit (HOSPITAL_BASED_OUTPATIENT_CLINIC_OR_DEPARTMENT_OTHER): Payer: Self-pay | Admitting: Family Medicine

## 2021-09-09 DIAGNOSIS — M7989 Other specified soft tissue disorders: Secondary | ICD-10-CM | POA: Diagnosis present

## 2021-12-06 ENCOUNTER — Other Ambulatory Visit: Payer: Self-pay

## 2021-12-06 ENCOUNTER — Ambulatory Visit: Payer: Medicaid Other | Attending: Family

## 2021-12-06 DIAGNOSIS — R293 Abnormal posture: Secondary | ICD-10-CM | POA: Insufficient documentation

## 2021-12-06 DIAGNOSIS — M546 Pain in thoracic spine: Secondary | ICD-10-CM | POA: Insufficient documentation

## 2021-12-06 DIAGNOSIS — M6281 Muscle weakness (generalized): Secondary | ICD-10-CM | POA: Insufficient documentation

## 2021-12-06 NOTE — Therapy (Signed)
[facility] [place], [place], [address] Phone: [phone]   Fax:  [phone]  Physical Therapy Evaluation  Patient Details  Name: Grace Sheppard MRN: [id number] Date of Birth: August 13, 1996 Referring Provider (PT): Dulcy Fanny, [place]   Encounter Date: 12/06/2021   PT End of Session - 12/06/21 1358     Visit Number 1    Number of Visits 8    Date for PT Re-Evaluation 02/04/22    PT Start Time 1359   Patient arrived late to her appointment   PT Stop Time 1425    PT Time Calculation (min) 26 min    Activity Tolerance Patient tolerated treatment well    Behavior During Therapy [facility] for tasks assessed/performed             Past Medical History:  Diagnosis Date   Anxiety    Conversion disorder    Post partum depression    Pseudoseizures ([place])    Syncope    recurrent    Past Surgical History:  Procedure Laterality Date   CHOLECYSTECTOMY     TUBAL LIGATION      There were no vitals filed for this visit.    Subjective Assessment - 12/06/21 1359     Subjective Patient reports that her upper and mid back have been hurting "on and off" for about 3 months. She notes that it just started bothering her one day with no known cause. She has alos noticed that her arms will get weak at times.    Pertinent History RA, fibromyalgia    Limitations Walking;Standing    How long can you walk comfortably? 2-3 hours    Patient Stated Goals less pain, increased movement, be able to work a full shift at work without pain    Currently in Pain? Yes    Pain Score 7     Pain Location Back    Pain Orientation Upper;Mid    Pain Descriptors / Indicators Tightness;Aching;Stabbing;Shooting    Pain Type Chronic pain    Pain Radiating Towards also has bilateral shoulder pain    Pain Onset More than a month ago    Pain Frequency Intermittent    Aggravating Factors  walking for long periods, sitting up straight    Pain Relieving Factors laying down     Effect of Pain on Daily Activities unable to lift for job demands or laundry, difficulty and pain with reaching overhead                [facility] PT Assessment - 12/06/21 0001       Assessment   Medical Diagnosis Chronic midline thoracic pain    Referring Provider (PT) Dulcy Fanny, FNP    Onset Date/Surgical Date --   about 3 months ago   Next MD Visit --   6 weeks from initial evaluation   Prior Therapy No      Precautions   Precautions None      Restrictions   Weight Bearing Restrictions No      Balance Screen   Has the patient fallen in the past 6 months No    Has the patient had a decrease in activity level because of a fear of falling?  No    Is the patient reluctant to leave their home because of a fear of falling?  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    Leisure fishing, camping      Cognition   Overall Cognitive Status Within Functional Limits for tasks assessed    Memory Appears intact    Awareness Appears intact    Problem Solving Appears intact      Sensation   Additional Comments Patient reports no numbness or tingling      Posture/Postural Control   Posture/Postural Control Postural limitations    Postural Limitations Rounded Shoulders;Forward head;Weight shift right;Increased thoracic kyphosis      ROM / Strength   AROM / PROM / Strength Strength;AROM      AROM   AROM Assessment Site Thoracic    Thoracic Flexion 10   prior to pain   Thoracic Extension 4   prior to pain   Thoracic - Right Side Bend minimal   painful   Thoracic - Left Side Bend minimal   painful   Thoracic - Right Rotation 50% limited   pulling (left rotation > right)   Thoracic - Left Rotation 50% limited   pulling (left rotation > right)     Strength   Strength  Assessment Site Shoulder    Right/Left Shoulder Right;Left    Right Shoulder Flexion 3+/5    Right Shoulder ABduction 4-/5    Right Shoulder Internal Rotation 4/5    Right Shoulder External Rotation 3+/5    Left Shoulder Flexion 3+/5    Left Shoulder ABduction 3+/5    Left Shoulder Internal Rotation 4-/5    Left Shoulder External Rotation 3/5      Palpation   Spinal mobility Thoracic: hypomobile and painful with lower thoracic (T7-12) being the most painful   Rib mobility: WFL and painful/sore   Palpation comment TTP: thoracic paraspinals (left>right and lower thoracic> upper), bilateral upper trapezius, and bilateral scapular retractors      Bed Mobility   Bed Mobility Rolling Right;Right Sidelying to Sit    Rolling Right Independent    Right Sidelying to Sit Independent      Transfers   Transfers Sit to Stand;Stand to Sit    Sit to Stand 7: Independent;With upper extremity assist    Stand to Sit 7: Independent;With upper extremity assist      Ambulation/Gait   Ambulation/Gait Yes    Ambulation/Gait Assistance 7: Independent    Assistive device None    Gait Pattern Within Functional Limits    Ambulation Surface Level;Indoor                        Objective measurements completed on examination: See above findings.                  PT Short Term Goals - 01/14/20 1113       PT SHORT TERM GOAL #1   Title Patient will be independent with HEP    Baseline no knowledge of HEP    Time 3    Period Weeks    Status New      PT SHORT TERM GOAL #2   Title Patient will demonstrate proper log roll techinque to protect abdominals during transfers    Baseline long sit to supine transition    Time 3    Period Weeks    Status New               PT Long Term  Goals - 12/06/21 1557       PT LONG TERM GOAL #1   Title Patient will be independent with her HEP.    Time 4    Period Weeks    Status New    Target Date 01/03/22      PT LONG TERM  GOAL #2   Title Patient will be able to demonstrate at least 25 degrees of pain free thoracic flexion for improved function dressing.    Time 4    Period Weeks    Status New    Target Date 01/03/22      PT LONG TERM GOAL #3   Title Patient will be able to demonstrate at least 4-/5 MMT scores with bilateral shoulder strength testing for improved ability to pick up and carry her groceries.    Time 4    Period Weeks    Status New    Target Date 01/03/22      PT LONG TERM GOAL #4   Title Patient will be able to complete her ADL's with no greater than 4/10 thoracic spine pain.    Time 4    Period Weeks    Status New    Target Date 01/03/22                    Plan - 12/06/21 1429     Clinical Impression Statement Patient is a 26 year old female presenting to physical therapy with thoracic spine pain. She presented today with moderate pain severity and irritability. She exhibited reduced upper extremity strength and thoracic mobility. She also exhibited increased pain and tenderness to palpation throughout her thoracic spine. Recommend that she continue with her recommended plan of care to address her remaining impairments to maximize her functional mobility.    Personal Factors and Comorbidities Time since onset of injury/illness/exacerbation;Profession;Comorbidity 1;Comorbidity 2    Comorbidities history of cancer, anxiety    Examination-Activity Limitations Sit;Carry;Stand;Lift    Examination-Participation Restrictions Occupation;Yard Work;Laundry;Cleaning    Stability/Clinical Decision Making Evolving/Moderate complexity    Clinical Decision Making Moderate    Rehab Potential Fair    PT Frequency 2x / week    PT Duration 4 weeks    PT Treatment/Interventions ADLs/Self Care Home Management;Neuromuscular re-education;Therapeutic exercise;Therapeutic activities;Manual techniques;Patient/family education    PT Next Visit Plan nustep, core stability and postural reeducation     Consulted and Agree with Plan of Care Patient             Patient will benefit from skilled therapeutic intervention in order to improve the following deficits and impairments:  Decreased range of motion, Impaired UE functional use, Decreased activity tolerance, Pain, Hypomobility, Decreased mobility, Decreased strength, Postural dysfunction  Visit Diagnosis: Pain in thoracic spine  Muscle weakness (generalized)  Abnormal posture     Problem List Patient Active Problem List   Diagnosis Date Noted   Cancer of the bladder, stage IV ([place]) 05/09/2019   Mild persistent asthma with acute exacerbation 04/11/2019   Anxiety and depression 01/01/2018   Conversion reaction 07/15/2016    Darlin Coco, PT 12/06/2021, 4:04 PM  [facility] [address] [place], [place], [address] Phone: [phone]   Fax:  [phone]  Name: Grace Sheppard MRN: [id number] Date of Birth: [birth year]

## 2021-12-27 ENCOUNTER — Ambulatory Visit: Payer: Medicaid Other

## 2021-12-27 ENCOUNTER — Other Ambulatory Visit: Payer: Self-pay

## 2021-12-27 DIAGNOSIS — R293 Abnormal posture: Secondary | ICD-10-CM

## 2021-12-27 DIAGNOSIS — M6281 Muscle weakness (generalized): Secondary | ICD-10-CM

## 2021-12-27 DIAGNOSIS — M546 Pain in thoracic spine: Secondary | ICD-10-CM

## 2021-12-27 NOTE — Therapy (Signed)
[facility] [place], [place], [address] Phone: [phone]   Fax:  [phone]  Physical Therapy Treatment  Patient Details  Name: Grace Sheppard MRN: [id number] Date of Birth: 1995-12-24 Referring Provider (PT): Dulcy Fanny, [place]   Encounter Date: 12/27/2021   PT End of Session - 12/27/21 0951     Visit Number 2    Number of Visits 8    Date for PT Re-Evaluation 02/04/22    PT Start Time 0945    PT Stop Time 1025    PT Time Calculation (min) 40 min    Activity Tolerance Patient tolerated treatment well    Behavior During Therapy [facility] for tasks assessed/performed             Past Medical History:  Diagnosis Date   Anxiety    Conversion disorder    Post partum depression    Pseudoseizures ([place])    Syncope    recurrent    Past Surgical History:  Procedure Laterality Date   CHOLECYSTECTOMY     TUBAL LIGATION      There were no vitals filed for this visit.   Subjective Assessment - 12/27/21 0950     Subjective Pt arrives for today's treatment session reporting 3/10 upper and mid back pain.    Pertinent History RA, fibromyalgia    Limitations Walking;Standing    How long can you walk comfortably? 2-3 hours    Patient Stated Goals less pain, increased movement, be able to work a full shift at work without pain    Currently in Pain? Yes    Pain Score 3     Pain Location Back    Pain Orientation Upper;Mid    Pain Onset More than a month ago                               [facility] Adult PT Treatment/Exercise - 12/27/21 0001       Exercises   Exercises Shoulder;Lumbar      Lumbar Exercises: Aerobic   Nustep Lvl 3 x 15 mins      Shoulder Exercises: Seated   Extension Strengthening;Both;15 reps;Theraband    Theraband Level (Shoulder Extension) Level 1 (Yellow)    Row Strengthening;Both;15 reps    Theraband Level (Shoulder Row) Level 1 (Yellow)    Other Seated Exercises shoulder shrugs,  shoulder cicles forward/backward, scap retraction x 15 reps each      Manual Therapy   Manual Therapy Soft tissue mobilization    Soft tissue mobilization STW/M to thoracic parapsinals to decrease pain and tone   pt positioned in sitting leaning over elevated treatment table                         PT Long Term Goals - 12/06/21 1557       PT LONG TERM GOAL #1   Title Patient will be independent with her HEP.    Time 4    Period Weeks    Status New    Target Date 01/03/22      PT LONG TERM GOAL #2   Title Patient will be able to demonstrate at least 25 degrees of pain free thoracic flexion for improved function dressing.    Time 4    Period Weeks    Status New    Target Date 01/03/22      PT LONG TERM GOAL #3  Title Patient will be able to demonstrate at least 4-/5 MMT scores with bilateral shoulder strength testing for improved ability to pick up and carry her groceries.    Time 4    Period Weeks    Status New    Target Date 01/03/22      PT LONG TERM GOAL #4   Title Patient will be able to complete her ADL's with no greater than 4/10 thoracic spine pain.    Time 4    Period Weeks    Status New    Target Date 01/03/22                   Plan - 12/27/21 0951     Clinical Impression Statement Pt arrives for today's treatment session reporting 3/10 thoracic back pain.  Pt able to tolerate introduction of Nustep for warmup without increase in pain.  Pt instructed in numerous seated shoulder and postural exercises to decrease pain and increase function.  Pt requiring min cues for posture and proper technique.  STW/M performed to thoracic paraspinals to decrease pain and tone with pt positioned in sitting leaning over elevated treatment table.  Pt reporte 2/10 thoracic back pain at completion of today's treatment session.    Personal Factors and Comorbidities Time since onset of injury/illness/exacerbation;Profession;Comorbidity 1;Comorbidity 2     Comorbidities history of cancer, anxiety    Examination-Activity Limitations Sit;Carry;Stand;Lift    Examination-Participation Restrictions Occupation;Yard Work;Laundry;Cleaning    Stability/Clinical Decision Making Evolving/Moderate complexity    Rehab Potential Fair    PT Frequency 2x / week    PT Duration 4 weeks    PT Treatment/Interventions ADLs/Self Care Home Management;Neuromuscular re-education;Therapeutic exercise;Therapeutic activities;Manual techniques;Patient/family education    PT Next Visit Plan nustep, core stability and postural reeducation    Consulted and Agree with Plan of Care Patient             Patient will benefit from skilled therapeutic intervention in order to improve the following deficits and impairments:  Decreased range of motion, Impaired UE functional use, Decreased activity tolerance, Pain, Hypomobility, Decreased mobility, Decreased strength, Postural dysfunction  Visit Diagnosis: Pain in thoracic spine  Muscle weakness (generalized)  Abnormal posture     Problem List Patient Active Problem List   Diagnosis Date Noted   Cancer of the bladder, stage IV ([place]) 05/09/2019   Mild persistent asthma with acute exacerbation 04/11/2019   Anxiety and depression 01/01/2018   Conversion reaction 07/15/2016    Kathrynn Ducking, PTA 12/27/2021, 10:31 AM  [facility] [address] [place], [place], [address] Phone: [phone]   Fax:  [phone]  Name: Grace Sheppard MRN: [id number] Date of Birth: 17-Apr-1996

## 2021-12-29 ENCOUNTER — Ambulatory Visit: Payer: Medicaid Other | Attending: Family

## 2021-12-29 ENCOUNTER — Other Ambulatory Visit: Payer: Self-pay

## 2021-12-29 DIAGNOSIS — M6281 Muscle weakness (generalized): Secondary | ICD-10-CM | POA: Diagnosis present

## 2021-12-29 DIAGNOSIS — R293 Abnormal posture: Secondary | ICD-10-CM | POA: Diagnosis present

## 2021-12-29 DIAGNOSIS — M546 Pain in thoracic spine: Secondary | ICD-10-CM | POA: Diagnosis not present

## 2021-12-29 NOTE — Therapy (Signed)
[facility] ?[place] ?[place], [place], [address] ?Phone: [phone]   Fax:  [phone] ? ?Physical Therapy Treatment ? ?Patient Details  ?Sheppard: Grace Sheppard ?MRN: [id number] ?Date of Birth: [birth year] ?Referring Provider (PT): Dulcy Fanny, FNP ? ? ?Encounter Date: 12/29/2021 ? ? PT End of Session - 12/29/21 0948   ? ? Visit Number 3   ? Number of Visits 8   ? Date for PT Re-Evaluation 02/04/22   ? PT Start Time 9622   ? PT Stop Time 1026   ? PT Time Calculation (min) 41 min   ? Activity Tolerance Patient tolerated treatment well   ? Behavior During Therapy [facility] for tasks assessed/performed   ? ?  ?  ? ?  ? ? ?Past Medical History:  ?Diagnosis Date  ? Anxiety   ? Conversion disorder   ? Post partum depression   ? Pseudoseizures ([place])   ? Syncope   ? recurrent  ? ? ?Past Surgical History:  ?Procedure Laterality Date  ? CHOLECYSTECTOMY    ? TUBAL LIGATION    ? ? ?There were no vitals filed for this visit. ? ? Subjective Assessment - 12/29/21 0947   ? ? Subjective Pt arrives for today's treatment session reporting 5/10 upper and mid back pain.  Pt states that her back felt really good for about 4 hrs after last treatment session.   ? Pertinent History RA, fibromyalgia   ? Limitations Walking;Standing   ? How long can you walk comfortably? 2-3 hours   ? Patient Stated Goals less pain, increased movement, be able to work a full shift at work without pain   ? Currently in Pain? Yes   ? Pain Score 5    ? Pain Location Back   ? Pain Orientation Upper;Mid   ? Pain Onset More than a month ago   ? ?  ?  ? ?  ? ? ? ? ? ? ? ? ? ? ? ? ? ? ? ? ? ? ? ? [facility] Adult PT Treatment/Exercise - 12/29/21 0001   ? ?  ? Lumbar Exercises: Aerobic  ? Nustep Lvl 3 x 15 mins   ?  ? Shoulder Exercises: Seated  ? Extension Strengthening;Both;15 reps;Theraband   ? Theraband Level (Shoulder Extension) Level 1 (Yellow)   ? Row Strengthening;Both;15 reps   ? Theraband Level (Shoulder Row) Level 1 (Yellow)   ?  Flexion Strengthening;Both;15 reps;Theraband   ? Theraband Level (Shoulder Flexion) Level 1 (Yellow)   ? Abduction Strengthening;Both;15 reps;Theraband   ? Theraband Level (Shoulder ABduction) Level 1 (Yellow)   ? Other Seated Exercises shoulder shrugs, shoulder cicles forward/backward, scap retraction x 15 reps each   ?  ? Manual Therapy  ? Manual Therapy Soft tissue mobilization   ? Soft tissue mobilization STW/M to thoracic parapsinals to decrease pain and tone   pt positioned in sitting leaning over elevated treatment table  ? ?  ?  ? ?  ? ? ? ? ? ? ? ? ? ? ? ? ? ? ? PT Long Term Goals - 12/06/21 1557   ? ?  ? PT LONG TERM GOAL #1  ? Title Patient will be independent with her HEP.   ? Time 4   ? Period Weeks   ? Status New   ? Target Date 01/03/22   ?  ? PT LONG TERM GOAL #2  ? Title Patient will be able to demonstrate at least 25 degrees of pain  free thoracic flexion for improved function dressing.   ? Time 4   ? Period Weeks   ? Status New   ? Target Date 01/03/22   ?  ? PT LONG TERM GOAL #3  ? Title Patient will be able to demonstrate at least 4-/5 MMT scores with bilateral shoulder strength testing for improved ability to pick up and carry her groceries.   ? Time 4   ? Period Weeks   ? Status New   ? Target Date 01/03/22   ?  ? PT LONG TERM GOAL #4  ? Title Patient will be able to complete her ADL's with no greater than 4/10 thoracic spine pain.   ? Time 4   ? Period Weeks   ? Status New   ? Target Date 01/03/22   ? ?  ?  ? ?  ? ? ? ? ? ? ? ? Plan - 12/29/21 0948   ? ? Clinical Impression Statement Pt arrives for today's treatment session reporting 5/10 thoracic back pain.  Pt instructed in seated resisted shoulder flexion and abduction with min cues for proper technique and posture.  Pt requiring min cues for reviewed exercises for proper technique.  STW/M performed to thoracic paraspinals to decrease pain and tone with pt leaning over elevated treatment table.  Pt reported 3/10 thoracic back pain at  completion of today's treatment session.   ? Personal Factors and Comorbidities Time since onset of injury/illness/exacerbation;Profession;Comorbidity 1;Comorbidity 2   ? Comorbidities history of cancer, anxiety   ? Examination-Activity Limitations Sit;Carry;Stand;Lift   ? Examination-Participation Restrictions Occupation;Yard Work;Laundry;Cleaning   ? Stability/Clinical Decision Making Evolving/Moderate complexity   ? Rehab Potential Fair   ? PT Frequency 2x / week   ? PT Duration 4 weeks   ? PT Treatment/Interventions ADLs/Self Care Home Management;Neuromuscular re-education;Therapeutic exercise;Therapeutic activities;Manual techniques;Patient/family education   ? PT Next Visit Plan nustep, core stability and postural reeducation   ? Consulted and Agree with Plan of Care Patient   ? ?  ?  ? ?  ? ? ?Patient will benefit from skilled therapeutic intervention in order to improve the following deficits and impairments:  Decreased range of motion, Impaired UE functional use, Decreased activity tolerance, Pain, Hypomobility, Decreased mobility, Decreased strength, Postural dysfunction ? ?Visit Diagnosis: ?Pain in thoracic spine ? ?Muscle weakness (generalized) ? ?Abnormal posture ? ? ? ? ?Problem List ?Patient Active Problem List  ? Diagnosis Date Noted  ? Cancer of the bladder, stage IV ([place]) 05/09/2019  ? Mild persistent asthma with acute exacerbation 04/11/2019  ? Anxiety and depression 01/01/2018  ? Conversion reaction 07/15/2016  ? ? ?Kathrynn Ducking, PTA ?12/29/2021, 10:29 AM ? ?[facility] ?[place] ?[place], [place], [address] ?Phone: [phone]   Fax:  [phone] ? ?Sheppard: Grace Sheppard ?MRN: [id number] ?Date of Birth: 08/17/1996 ? ? ? ?

## 2022-01-03 ENCOUNTER — Ambulatory Visit: Payer: Medicaid Other

## 2022-01-03 ENCOUNTER — Other Ambulatory Visit: Payer: Self-pay

## 2022-01-03 DIAGNOSIS — R293 Abnormal posture: Secondary | ICD-10-CM

## 2022-01-03 DIAGNOSIS — M546 Pain in thoracic spine: Secondary | ICD-10-CM

## 2022-01-03 DIAGNOSIS — M6281 Muscle weakness (generalized): Secondary | ICD-10-CM

## 2022-01-03 NOTE — Therapy (Signed)
[place] ?[facility] ?[place] ?[place], [place], [address] ?Phone: [phone]   Fax:  [phone] ? ?Physical Therapy Treatment ? ?Patient Details  ?Name: Grace Sheppard ?MRN: [id number] ?Date of Birth: May 10, 1996 ?Referring Provider (PT): Dulcy Fanny, FNP ? ? ?Encounter Date: 01/03/2022 ? ? PT End of Session - 01/03/22 0093   ? ? Visit Number 4   ? Number of Visits 8   ? Date for PT Re-Evaluation 02/04/22   ? PT Start Time [phone]   ? PT Stop Time 1030   ? PT Time Calculation (min) 41 min   ? Activity Tolerance Patient tolerated treatment well   ? Behavior During Therapy [facility] for tasks assessed/performed   ? ?  ?  ? ?  ? ? ?Past Medical History:  ?Diagnosis Date  ? Anxiety   ? Conversion disorder   ? Post partum depression   ? Pseudoseizures ([place])   ? Syncope   ? recurrent  ? ? ?Past Surgical History:  ?Procedure Laterality Date  ? CHOLECYSTECTOMY    ? TUBAL LIGATION    ? ? ?There were no vitals filed for this visit. ? ? Subjective Assessment - 01/03/22 0950   ? ? Subjective Pt arrives for today's treatment session reporting 5/10 upper/mid back pain.  Pt states that she had a fall over the weekend from feeling dizzy, but states that she did not hurt her back in the fall.   ? Pertinent History RA, fibromyalgia   ? Limitations Walking;Standing   ? How long can you walk comfortably? 2-3 hours   ? Patient Stated Goals less pain, increased movement, be able to work a full shift at work without pain   ? Currently in Pain? Yes   ? Pain Score 5    ? Pain Location Back   ? Pain Orientation Upper;Mid   ? Pain Onset More than a month ago   ? ?  ?  ? ?  ? ? ? ? ? ? ? ? ? ? ? ? ? ? ? ? ? ? ? ? [place] Adult PT Treatment/Exercise - 01/03/22 0001   ? ?  ? Lumbar Exercises: Aerobic  ? Nustep Lvl 3 x 15 mins   ?  ? Shoulder Exercises: Seated  ? Extension Strengthening;Both;Theraband;20 reps   ? Theraband Level (Shoulder Extension) Level 1 (Yellow)   ? Row Strengthening;Both;20 reps   ? Theraband Level (Shoulder  Row) Level 2 (Red)   ? Flexion Strengthening;Both;Theraband;20 reps   ? Theraband Level (Shoulder Flexion) Level 1 (Yellow)   ? Abduction Strengthening;Both;Theraband;20 reps   ? Theraband Level (Shoulder ABduction) Level 1 (Yellow)   ? Other Seated Exercises shoulder shrugs, shoulder cicles forward/backward, scap retraction x 20 reps each   ?  ? Manual Therapy  ? Manual Therapy Soft tissue mobilization   ? Soft tissue mobilization STW/M to thoracic parapsinals to decrease pain and tone   ? ?  ?  ? ?  ? ? ? ? ? ? ? ? ? ? ? ? ? ? ? PT Long Term Goals - 01/03/22 0953   ? ?  ? PT LONG TERM GOAL #1  ? Title Patient will be independent with her HEP.   ? Time 4   ? Period Weeks   ? Status Achieved   ? Target Date 01/03/22   ?  ? PT LONG TERM GOAL #2  ? Title Patient will be able to demonstrate at least 25 degrees of pain free thoracic flexion  for improved function dressing.   ? Time 4   ? Period Weeks   ? Status On-going   ? Target Date 01/03/22   ?  ? PT LONG TERM GOAL #3  ? Title Patient will be able to demonstrate at least 4-/5 MMT scores with bilateral shoulder strength testing for improved ability to pick up and carry her groceries.   ? Time 4   ? Period Weeks   ? Status On-going   ? Target Date 01/03/22   ?  ? PT LONG TERM GOAL #4  ? Title Patient will be able to complete her ADL's with no greater than 4/10 thoracic spine pain.   ? Baseline 01/03/22: depending the day and activities thoracic pain in 4/10 or greater   ? Time 4   ? Period Weeks   ? Status New   ? Target Date 01/03/22   ? ?  ?  ? ?  ? ? ? ? ? ? ? ? Plan - 01/03/22 0952   ? ? Clinical Impression Statement Pt arrives for today's treatment session reporting 5/10 thoracic back pain.  Pt states that she had a fall over the weekend caused by dizziness, but does not feel that the fall hurt her back.  Pt able to tolerate increased resistance with seated rows and increased reps with all exercises during today's treatment session.  STW/M performed to thoracic  paraspinals to decrease pain and tone with pt leaning over elevated treatment table.  Pt reported 3/10 thoracic back pain at completion of today's treatment session.   Pt states she has an appt with MD tomorrow; encouraged to tell MD about fall.   ? Personal Factors and Comorbidities Time since onset of injury/illness/exacerbation;Profession;Comorbidity 1;Comorbidity 2   ? Comorbidities history of cancer, anxiety   ? Examination-Activity Limitations Sit;Carry;Stand;Lift   ? Examination-Participation Restrictions Occupation;Yard Work;Laundry;Cleaning   ? Stability/Clinical Decision Making Evolving/Moderate complexity   ? Rehab Potential Fair   ? PT Frequency 2x / week   ? PT Duration 4 weeks   ? PT Treatment/Interventions ADLs/Self Care Home Management;Neuromuscular re-education;Therapeutic exercise;Therapeutic activities;Manual techniques;Patient/family education   ? PT Next Visit Plan nustep, core stability and postural reeducation   ? Consulted and Agree with Plan of Care Patient   ? ?  ?  ? ?  ? ? ?Patient will benefit from skilled therapeutic intervention in order to improve the following deficits and impairments:  Decreased range of motion, Impaired UE functional use, Decreased activity tolerance, Pain, Hypomobility, Decreased mobility, Decreased strength, Postural dysfunction ? ?Visit Diagnosis: ?Pain in thoracic spine ? ?Muscle weakness (generalized) ? ?Abnormal posture ? ? ? ? ?Problem List ?Patient Active Problem List  ? Diagnosis Date Noted  ? Cancer of the bladder, stage IV ([place]) 05/09/2019  ? Mild persistent asthma with acute exacerbation 04/11/2019  ? Anxiety and depression 01/01/2018  ? Conversion reaction 07/15/2016  ? ? ?Kathrynn Ducking, PTA ?01/03/2022, 10:34 AM ? ?[place] [facility] ?[place] ?[place], [place], [address] ?Phone: [phone]   Fax:  [phone] ? ?Name: JOBETH PANGILINAN ?MRN: [id number] ?Date of Birth: 11-Nov-1995 ? ? ? ?

## 2022-01-06 ENCOUNTER — Ambulatory Visit: Payer: Medicaid Other | Admitting: *Deleted

## 2022-01-06 ENCOUNTER — Other Ambulatory Visit: Payer: Self-pay

## 2022-01-06 DIAGNOSIS — M6281 Muscle weakness (generalized): Secondary | ICD-10-CM

## 2022-01-06 DIAGNOSIS — M546 Pain in thoracic spine: Secondary | ICD-10-CM | POA: Diagnosis not present

## 2022-01-06 DIAGNOSIS — R293 Abnormal posture: Secondary | ICD-10-CM

## 2022-01-06 NOTE — Therapy (Signed)
?[facility] ?[place] ?[place], [place], [address] ?Phone: [phone]   Fax:  [phone] ? ?Physical Therapy Treatment ? ?Patient Details  ?Name: Grace Sheppard ?MRN: [id number] ?Date of Birth: 27-Sep-1996 ?Referring Provider (PT): Dulcy Fanny, FNP ? ? ?Encounter Date: 01/06/2022 ? ? PT End of Session - 01/06/22 1000   ? ? Visit Number 5   ? Number of Visits 8   ? Date for PT Re-Evaluation 02/04/22   ? PT Start Time [phone]   ? PT Stop Time 1036   ? PT Time Calculation (min) 44 min   ? ?  ?  ? ?  ? ? ?Past Medical History:  ?Diagnosis Date  ? Anxiety   ? Conversion disorder   ? Post partum depression   ? Pseudoseizures ([place])   ? Syncope   ? recurrent  ? ? ?Past Surgical History:  ?Procedure Laterality Date  ? CHOLECYSTECTOMY    ? TUBAL LIGATION    ? ? ?There were no vitals filed for this visit. ? ? Subjective Assessment - 01/06/22 0957   ? ? Subjective Pt arrives for today's treatment session reporting 3/10 upper/mid back pain. Went to MD Tuesday and was negative for fractures. Will be referred to spine specialist   ? Pertinent History RA, fibromyalgia   ? Limitations Walking;Standing   ? How long can you walk comfortably? 2-3 hours   ? Patient Stated Goals less pain, increased movement, be able to work a full shift at work without pain   ? Currently in Pain? Yes   ? Pain Score 3    ? Pain Location Back   ? Pain Orientation Upper   ? Pain Descriptors / Indicators Tightness   ? Pain Type Chronic pain   ? Pain Onset More than a month ago   ? Pain Frequency Intermittent   ? ?  ?  ? ?  ? ? ? ? ? ? ? ? ? ? ? ? ? ? ? ? ? ? ? ? [place] Adult PT Treatment/Exercise - 01/06/22 0001   ? ?  ? Lumbar Exercises: Aerobic  ? Nustep Lvl 3 x 15 mins   ?  ? Shoulder Exercises: Seated  ? Extension Strengthening;Both;15 reps   XTS green 2x15 hold 5 secs standing  ? Row Strengthening;Both;15 reps   XTS green 2x15 standing  ? Horizontal ABduction Right;Theraband   standing 2x10  ? Theraband Level (Shoulder  Horizontal ABduction) Level 1 (Yellow)   ?  ? Manual Therapy  ? Manual Therapy Soft tissue mobilization   ? Soft tissue mobilization STW/M to thoracic parapsinals to decrease pain and tone. IASTM all in seated position   ? ?  ?  ? ?  ? ? ? ? ? ? ? ? ? ? ? ? ? ? ? PT Long Term Goals - 01/03/22 0953   ? ?  ? PT LONG TERM GOAL #1  ? Title Patient will be independent with her HEP.   ? Time 4   ? Period Weeks   ? Status Achieved   ? Target Date 01/03/22   ?  ? PT LONG TERM GOAL #2  ? Title Patient will be able to demonstrate at least 25 degrees of pain free thoracic flexion for improved function dressing.   ? Time 4   ? Period Weeks   ? Status On-going   ? Target Date 01/03/22   ?  ? PT LONG TERM GOAL #3  ? Title Patient will  be able to demonstrate at least 4-/5 MMT scores with bilateral shoulder strength testing for improved ability to pick up and carry her groceries.   ? Time 4   ? Period Weeks   ? Status On-going   ? Target Date 01/03/22   ?  ? PT LONG TERM GOAL #4  ? Title Patient will be able to complete her ADL's with no greater than 4/10 thoracic spine pain.   ? Baseline 01/03/22: depending the day and activities thoracic pain in 4/10 or greater   ? Time 4   ? Period Weeks   ? Status New   ? Target Date 01/03/22   ? ?  ?  ? ?  ? ? ? ? ? ? ? ? Plan - 01/06/22 1002   ? ? Clinical Impression Statement Pt arrived today doing fairly well with mainlytightness/soreness in thoracic paras.She reports that she will see a spine specialist for further assessment. She did well with focus on posture exs and STW to BIl. thoracic paras with decreased tightness end of session.   ? Personal Factors and Comorbidities Time since onset of injury/illness/exacerbation;Profession;Comorbidity 1;Comorbidity 2   ? Comorbidities history of cancer, anxiety   ? Examination-Participation Restrictions Occupation;Yard Work;Laundry;Cleaning   ? Stability/Clinical Decision Making Evolving/Moderate complexity   ? Rehab Potential Fair   ? PT Frequency  2x / week   ? PT Treatment/Interventions ADLs/Self Care Home Management;Neuromuscular re-education;Therapeutic exercise;Therapeutic activities;Manual techniques;Patient/family education   ? PT Next Visit Plan nustep, core stability and postural reeducation   ? ?  ?  ? ?  ? ? ?Patient will benefit from skilled therapeutic intervention in order to improve the following deficits and impairments:  Decreased range of motion, Impaired UE functional use, Decreased activity tolerance, Pain, Hypomobility, Decreased mobility, Decreased strength, Postural dysfunction ? ?Visit Diagnosis: ?Pain in thoracic spine ? ?Muscle weakness (generalized) ? ?Abnormal posture ? ? ? ? ?Problem List ?Patient Active Problem List  ? Diagnosis Date Noted  ? Cancer of the bladder, stage IV ([place]) 05/09/2019  ? Mild persistent asthma with acute exacerbation 04/11/2019  ? Anxiety and depression 01/01/2018  ? Conversion reaction 07/15/2016  ? ? ?Joniah Bednarski,CHRIS, PTA ?01/06/2022, 4:15 PM ? ? ?[facility] ?[place] ?[place], [place], [address] ?Phone: [phone]   Fax:  [phone] ? ?Name: Grace Sheppard ?MRN: [id number] ?Date of Birth: 10/14/1996 ? ? ? ?

## 2022-01-10 ENCOUNTER — Ambulatory Visit: Payer: Medicaid Other | Admitting: Physical Therapy

## 2022-01-10 ENCOUNTER — Encounter: Payer: Self-pay | Admitting: Physical Therapy

## 2022-01-10 ENCOUNTER — Other Ambulatory Visit: Payer: Self-pay

## 2022-01-10 DIAGNOSIS — M6281 Muscle weakness (generalized): Secondary | ICD-10-CM

## 2022-01-10 DIAGNOSIS — R293 Abnormal posture: Secondary | ICD-10-CM

## 2022-01-10 DIAGNOSIS — M546 Pain in thoracic spine: Secondary | ICD-10-CM

## 2022-01-10 NOTE — Therapy (Signed)
[place] ?[facility] ?[place] ?[place], [place], [address] ?Phone: [phone]   Fax:  [phone] ? ?Physical Therapy Treatment ? ?Patient Details  ?Name: Grace Sheppard ?MRN: [id number] ?Date of Birth: Apr 05, 1996 ?Referring Provider (PT): Dulcy Fanny, FNP ? ? ?Encounter Date: 01/10/2022 ? ? PT End of Session - 01/10/22 0949   ? ? Visit Number 6   ? Number of Visits 8   ? Date for PT Re-Evaluation 02/04/22   ? PT Start Time 3419   ? PT Stop Time 1029   ? PT Time Calculation (min) 44 min   ? Activity Tolerance Patient tolerated treatment well   ? Behavior During Therapy [facility] for tasks assessed/performed   ? ?  ?  ? ?  ? ? ?Past Medical History:  ?Diagnosis Date  ? Anxiety   ? Conversion disorder   ? Post partum depression   ? Pseudoseizures ([place])   ? Syncope   ? recurrent  ? ? ?Past Surgical History:  ?Procedure Laterality Date  ? CHOLECYSTECTOMY    ? TUBAL LIGATION    ? ? ?There were no vitals filed for this visit. ? ? Subjective Assessment - 01/10/22 0947   ? ? Subjective Arrives reporting more soreness today which she attributes to weather. Denies any new falls but reports that her back feels very tight. Reported more difficulty with HEP yesterday due to the tightness.   ? Pertinent History RA, fibromyalgia   ? Limitations Walking;Standing   ? How long can you walk comfortably? 2-3 hours   ? Patient Stated Goals less pain, increased movement, be able to work a full shift at work without pain   ? Currently in Pain? Yes   ? Pain Score 5    ? Pain Location Back   ? Pain Orientation Upper   ? Pain Descriptors / Indicators Tightness   ? Pain Type Chronic pain   ? Pain Onset More than a month ago   ? Pain Frequency Intermittent   ? ?  ?  ? ?  ? ? ? ? ? [facility] PT Assessment - 01/10/22 0001   ? ?  ? Assessment  ? Medical Diagnosis Chronic midline thoracic pain   ? Referring Provider (PT) Dulcy Fanny, [place]   ? Prior Therapy No   ?  ? Precautions  ? Precautions None   ? ?  ?  ? ?   ? ? ? ? ? ? ? ? ? ? ? ? ? ? ? ? [place] Adult PT Treatment/Exercise - 01/10/22 0001   ? ?  ? Lumbar Exercises: Aerobic  ? Nustep Lvl 3 x 15 mins   ?  ? Lumbar Exercises: Standing  ? Row Strengthening;Both;20 reps;Limitations   ? Row Limitations blue XTS   ? Shoulder Extension Strengthening;Both;20 reps;Limitations   ? Shoulder Extension Limitations blue XTS   ?  ? Shoulder Exercises: Standing  ? Flexion AROM;Both   ? Other Standing Exercises snow angels x20 reps,   ? Other Standing Exercises B shoulder scaption x20 reps   ?  ? Shoulder Exercises: ROM/Strengthening  ? Wall Pushups 20 reps   ?  ? Manual Therapy  ? Manual Therapy Soft tissue mobilization   ? Soft tissue mobilization STW/M to thoracic parapsinals to decrease pain and tone.   ? ?  ?  ? ?  ? ? ? ? ? ? ? ? ? ? ? ? ? ? ? PT Long Term Goals - 01/03/22 0953   ? ?  ?  PT LONG TERM GOAL #1  ? Title Patient will be independent with her HEP.   ? Time 4   ? Period Weeks   ? Status Achieved   ? Target Date 01/03/22   ?  ? PT LONG TERM GOAL #2  ? Title Patient will be able to demonstrate at least 25 degrees of pain free thoracic flexion for improved function dressing.   ? Time 4   ? Period Weeks   ? Status On-going   ? Target Date 01/03/22   ?  ? PT LONG TERM GOAL #3  ? Title Patient will be able to demonstrate at least 4-/5 MMT scores with bilateral shoulder strength testing for improved ability to pick up and carry her groceries.   ? Time 4   ? Period Weeks   ? Status On-going   ? Target Date 01/03/22   ?  ? PT LONG TERM GOAL #4  ? Title Patient will be able to complete her ADL's with no greater than 4/10 thoracic spine pain.   ? Baseline 01/03/22: depending the day and activities thoracic pain in 4/10 or greater   ? Time 4   ? Period Weeks   ? Status New   ? Target Date 01/03/22   ? ?  ?  ? ?  ? ? ? ? ? ? ? ? Plan - 01/10/22 1123   ? ? Clinical Impression Statement Patient presented in clinic with reports of more mid to upper back tightness. Patient correlated the  increased pain to cold, damp weather and RA. Patient progressed through light postural strengthening with no complaints via patient. Light reps utilized in order to avoid increased pain and fatigue. Mild tightness of thoracic paraspinals noted with STW.   ? Personal Factors and Comorbidities Time since onset of injury/illness/exacerbation;Profession;Comorbidity 1;Comorbidity 2   ? Comorbidities history of cancer, anxiety   ? Examination-Activity Limitations Sit;Carry;Stand;Lift   ? Examination-Participation Restrictions Occupation;Yard Work;Laundry;Cleaning   ? Stability/Clinical Decision Making Evolving/Moderate complexity   ? Rehab Potential Fair   ? PT Frequency 2x / week   ? PT Duration 4 weeks   ? PT Treatment/Interventions ADLs/Self Care Home Management;Neuromuscular re-education;Therapeutic exercise;Therapeutic activities;Manual techniques;Patient/family education   ? PT Next Visit Plan nustep, core stability and postural reeducation   ? Consulted and Agree with Plan of Care Patient   ? ?  ?  ? ?  ? ? ?Patient will benefit from skilled therapeutic intervention in order to improve the following deficits and impairments:  Decreased range of motion, Impaired UE functional use, Decreased activity tolerance, Pain, Hypomobility, Decreased mobility, Decreased strength, Postural dysfunction ? ?Visit Diagnosis: ?Pain in thoracic spine ? ?Muscle weakness (generalized) ? ?Abnormal posture ? ? ? ? ?Problem List ?Patient Active Problem List  ? Diagnosis Date Noted  ? Cancer of the bladder, stage IV ([place]) 05/09/2019  ? Mild persistent asthma with acute exacerbation 04/11/2019  ? Anxiety and depression 01/01/2018  ? Conversion reaction 07/15/2016  ? ? ?Standley Brooking, PTA ?01/10/2022, 11:27 AM ? ?[place] ?[facility] ?[place] ?[place], [place], [address] ?Phone: [phone]   Fax:  [phone] ? ?Name: Grace Sheppard ?MRN: [id number] ?Date of Birth: [birth year] ? ? ? ?

## 2022-01-12 ENCOUNTER — Other Ambulatory Visit: Payer: Self-pay

## 2022-01-12 ENCOUNTER — Ambulatory Visit: Payer: Medicaid Other | Admitting: Physical Therapy

## 2022-01-12 ENCOUNTER — Encounter: Payer: Self-pay | Admitting: Physical Therapy

## 2022-01-12 DIAGNOSIS — M546 Pain in thoracic spine: Secondary | ICD-10-CM | POA: Diagnosis not present

## 2022-01-12 DIAGNOSIS — M6281 Muscle weakness (generalized): Secondary | ICD-10-CM

## 2022-01-12 DIAGNOSIS — R293 Abnormal posture: Secondary | ICD-10-CM

## 2022-01-12 NOTE — Therapy (Addendum)
[facility] [place], [place], [address] Phone: [phone]   Fax:  [phone]  Physical Therapy Treatment  Patient Details  Name: Grace Sheppard MRN: [id number] Date of Birth: 05-12-1996 Referring Provider (PT): Dulcy Fanny, [place]   Encounter Date: 01/12/2022   PT End of Session - 01/12/22 1002     Visit Number 7    Number of Visits 8    Date for PT Re-Evaluation 02/04/22    PT Start Time 0946    PT Stop Time 1032    PT Time Calculation (min) 46 min    Activity Tolerance Patient tolerated treatment well    Behavior During Therapy [facility] for tasks assessed/performed             Past Medical History:  Diagnosis Date   Anxiety    Conversion disorder    Post partum depression    Pseudoseizures ([place])    Syncope    recurrent    Past Surgical History:  Procedure Laterality Date   CHOLECYSTECTOMY     TUBAL LIGATION      There were no vitals filed for this visit.   Subjective Assessment - 01/12/22 0955     Subjective Reports some tightness and soreness but less than yesterday. Reports that she went to Urgent Care yesterday for a cough and got meds.    Pertinent History RA, fibromyalgia    Limitations Walking;Standing    How long can you walk comfortably? 2-3 hours    Patient Stated Goals less pain, increased movement, be able to work a full shift at work without pain    Currently in Pain? Yes    Pain Score 4     Pain Location Back    Pain Orientation Mid    Pain Descriptors / Indicators Tightness;Sore    Pain Type Chronic pain    Pain Onset More than a month ago    Pain Frequency Intermittent                [facility] PT Assessment - 01/12/22 0001       Assessment   Medical Diagnosis Chronic midline thoracic pain    Referring Provider (PT) Dulcy Fanny, FNP    Prior Therapy No      Precautions   Precautions None                           [facility] Adult PT Treatment/Exercise - 01/12/22 0001        Lumbar Exercises: Aerobic   Nustep Lvl 3 x 15 mins      Shoulder Exercises: Standing   Horizontal ABduction Strengthening;Both;20 reps;Theraband    Theraband Level (Shoulder Horizontal ABduction) Level 3 (Green)    External Rotation Strengthening;Both;20 reps;Theraband    Theraband Level (Shoulder External Rotation) Level 3 (Green)    Flexion Strengthening;Both;20 reps;Theraband    Theraband Level (Shoulder Flexion) Level 3 (Green)   with hor. abd.   Flexion Limitations B shoulder flexion 1# x20 reps    ABduction Strengthening;Both;20 reps;Weights    Theraband Level (Shoulder ABduction) Level 1 (Yellow)    Extension Strengthening;Both;20 reps;Limitations    Extension Limitations blue XTS    Row Strengthening;Both;20 reps;Limitations    Row Limitations blue XTS    Diagonals Strengthening;Both;20 reps;Limitations    Diagonals Limitations blue XTS      Shoulder Exercises: ROM/Strengthening   Wall Pushups 20 reps      Manual Therapy   Manual Therapy Soft tissue  mobilization    Soft tissue mobilization STW/M to thoracic parapsinals to decrease pain and tone.                          PT Long Term Goals - 01/03/22 0953       PT LONG TERM GOAL #1   Title Patient will be independent with her HEP.    Time 4    Period Weeks    Status Achieved    Target Date 01/03/22      PT LONG TERM GOAL #2   Title Patient will be able to demonstrate at least 25 degrees of pain free thoracic flexion for improved function dressing.    Time 4    Period Weeks    Status On-going    Target Date 01/03/22      PT LONG TERM GOAL #3   Title Patient will be able to demonstrate at least 4-/5 MMT scores with bilateral shoulder strength testing for improved ability to pick up and carry her groceries.    Time 4    Period Weeks    Status On-going    Target Date 01/03/22      PT LONG TERM GOAL #4   Title Patient will be able to complete her ADL's with no greater than 4/10 thoracic spine  pain.    Baseline 01/03/22: depending the day and activities thoracic pain in 4/10 or greater    Time 4    Period Weeks    Status New    Target Date 01/03/22                   Plan - 01/12/22 1308     Clinical Impression Statement Patient presented in clinic with mod tightness and soreness in thoracic musculature. Patient continued through postural and thoracic strengthening with intermittant but quick rest breaks. Increased resistance also utilized during therex. Mild to moderate thoracic musculature tightness palpable today but less pain following manual therapy session.    Personal Factors and Comorbidities Time since onset of injury/illness/exacerbation;Profession;Comorbidity 1;Comorbidity 2    Comorbidities history of cancer, anxiety    Examination-Activity Limitations Sit;Carry;Stand;Lift    Examination-Participation Restrictions Occupation;Yard Work;Laundry;Cleaning    Stability/Clinical Decision Making Evolving/Moderate complexity    Rehab Potential Fair    PT Frequency 2x / week    PT Duration 4 weeks    PT Treatment/Interventions ADLs/Self Care Home Management;Neuromuscular re-education;Therapeutic exercise;Therapeutic activities;Manual techniques;Patient/family education    PT Next Visit Plan nustep, core stability and postural reeducation    Consulted and Agree with Plan of Care Patient             Patient will benefit from skilled therapeutic intervention in order to improve the following deficits and impairments:  Decreased range of motion, Impaired UE functional use, Decreased activity tolerance, Pain, Hypomobility, Decreased mobility, Decreased strength, Postural dysfunction  Visit Diagnosis: Pain in thoracic spine  Muscle weakness (generalized)  Abnormal posture     Problem List Patient Active Problem List   Diagnosis Date Noted   Cancer of the bladder, stage IV ([place]) 05/09/2019   Mild persistent asthma with acute exacerbation 04/11/2019    Anxiety and depression 01/01/2018   Conversion reaction 07/15/2016    Standley Brooking, PTA 01/12/2022, 1:14 PM  [facility] [address] [place], [place], [address] Phone: [phone]   Fax:  [phone]  Name: Grace Sheppard MRN: [id number] Date of Birth: [birth year]  PHYSICAL THERAPY DISCHARGE  SUMMARY  Visits from Start of Care: 7  Current functional level related to goals / functional outcomes: Patient is being discharged at this time as she has not returned since her last appointment.    Remaining deficits: Strength and pain    Education / Equipment: HEP    Patient agrees to discharge. Patient goals were not met. Patient is being discharged due to not returning since the last visit.  Jacqulynn Cadet, PT, DPT

## 2022-01-18 DIAGNOSIS — R202 Paresthesia of skin: Secondary | ICD-10-CM | POA: Diagnosis not present

## 2022-01-18 DIAGNOSIS — M6281 Muscle weakness (generalized): Secondary | ICD-10-CM | POA: Diagnosis not present

## 2022-04-13 ENCOUNTER — Other Ambulatory Visit (HOSPITAL_COMMUNITY): Payer: Self-pay | Admitting: Neurosurgery

## 2022-04-13 ENCOUNTER — Other Ambulatory Visit: Payer: Self-pay | Admitting: Neurosurgery

## 2022-04-13 DIAGNOSIS — G8929 Other chronic pain: Secondary | ICD-10-CM

## 2022-05-05 ENCOUNTER — Ambulatory Visit (HOSPITAL_COMMUNITY)
Admission: RE | Admit: 2022-05-05 | Discharge: 2022-05-05 | Disposition: A | Discharge status: Home / Self Care | Payer: Medicaid Other | Source: Ambulatory Visit | Attending: Neurosurgery | Admitting: Neurosurgery

## 2022-05-05 DIAGNOSIS — G8929 Other chronic pain: Secondary | ICD-10-CM | POA: Diagnosis present

## 2022-05-05 DIAGNOSIS — M546 Pain in thoracic spine: Secondary | ICD-10-CM | POA: Insufficient documentation

## 2022-05-06 ENCOUNTER — Ambulatory Visit (HOSPITAL_COMMUNITY): Payer: Medicaid Other

## 2022-05-11 ENCOUNTER — Encounter: Payer: Medicaid Other | Admitting: Advanced Practice Midwife

## 2022-05-11 ENCOUNTER — Encounter: Payer: Medicaid Other | Admitting: Adult Health

## 2022-05-20 ENCOUNTER — Ambulatory Visit: Payer: Medicaid Other | Admitting: Obstetrics & Gynecology

## 2022-05-20 ENCOUNTER — Encounter: Payer: Self-pay | Admitting: Obstetrics & Gynecology

## 2022-05-20 ENCOUNTER — Other Ambulatory Visit (HOSPITAL_COMMUNITY)
Admission: RE | Admit: 2022-05-20 | Discharge: 2022-05-20 | Disposition: A | Discharge status: Home / Self Care | Payer: Medicaid Other | Source: Ambulatory Visit | Attending: Obstetrics & Gynecology | Admitting: Obstetrics & Gynecology

## 2022-05-20 VITALS — BP 133/88 | HR 81 | Ht 64.0 in | Wt 180.6 lb

## 2022-05-20 DIAGNOSIS — Z113 Encounter for screening for infections with a predominantly sexual mode of transmission: Secondary | ICD-10-CM | POA: Diagnosis not present

## 2022-05-20 DIAGNOSIS — Z124 Encounter for screening for malignant neoplasm of cervix: Secondary | ICD-10-CM | POA: Insufficient documentation

## 2022-05-20 DIAGNOSIS — N898 Other specified noninflammatory disorders of vagina: Secondary | ICD-10-CM

## 2022-05-20 DIAGNOSIS — R102 Pelvic and perineal pain: Secondary | ICD-10-CM | POA: Diagnosis not present

## 2022-05-20 NOTE — Progress Notes (Signed)
GYN VISIT Patient name: Grace Sheppard MRN [id number]  Date of birth: 14-May-1996 Chief Complaint:   Pelvic Pain (Has been going on for a while)  History of Present Illness:   Grace Sheppard is a 26 y.o. G8P2002  female being seen today for the following concerns:  -Pelvic pain:  going on for about a year.  Sharp pain, intermittent.  Initially following IUD pain had improved; however, recently the pain has gotten worse.  Most noticeable is dyspareunia.  Notes both pain with insertion and during.  Pain is sporadic, mostly on the right side.  Will take OTC medicine- sometimes it helps, sometimes not.  Pain does not appear to be cyclical.  She does struggle with constipation.  Also has urinary issues- seen by urology.  -Vaginal discharge: Intermittent white discharge with occasional odor.  No OTC treatment.  Same partner  Mirena-not sure when it was placed.       No LMP recorded (lmp unknown). (Menstrual status: IUD).  Contraception: BTL    08/15/2018    8:26 AM 01/01/2018    4:15 PM 07/15/2016    8:21 AM 05/05/2016   11:33 AM  Depression screen PHQ 2/9  Decreased Interest 1 1 0 1  Down, Depressed, Hopeless 1 1 0 0  PHQ - 2 Score 2 2 0 1  Altered sleeping 2 1    Tired, decreased energy 3 1    Change in appetite 2 1    Feeling bad or failure about yourself  1 1    Trouble concentrating 1 1    Moving slowly or fidgety/restless 0 1    Suicidal thoughts 0 0    PHQ-9 Score 11 8    Difficult doing work/chores Somewhat difficult Somewhat difficult       Review of Systems:   Pertinent items are noted in HPI Denies fever/chills, dizziness, headaches, visual disturbances, fatigue, shortness of breath, chest pain, abdominal pain, vomiting, see HPI regarding GI symptoms.  Pertinent History Reviewed:  Reviewed past medical,surgical, social, obstetrical and family history.  Reviewed problem list, medications and allergies. Physical Assessment:   Vitals:   05/20/22 1106  BP: 133/88  Pulse: 81   Weight: 180 lb 9.6 oz (81.9 kg)  Height: '5\' 4"'$  (1.626 m)  Body mass index is 31 kg/m.       Physical Examination:   General appearance: alert, well appearing, and in no distress  Psych: mood appropriate, normal affect  Skin: warm & dry   Cardiovascular: normal heart rate noted  Respiratory: normal respiratory effort, no distress  Abdomen: soft, non-tender, note RLQ pain near ASIS and lower- no rebound, no guarding  Pelvic: VULVA: normal appearing vulva with no masses, tenderness or lesions, VAGINA: normal appearing vagina with normal color and discharge, no lesions, CERVIX: normal appearing cervix without discharge or lesions, strings not visualized, UTERUS: uterus is normal size, shape, consistency and nontender, ADNEXA: normal adnexa in size, nontender and no masses- no reproducible pain  Extremities: no edema   Chaperone: Celene Squibb    Assessment & Plan:  1) Pelvic pain -plan for TVUS at next available, further management pending results -reviewed conservative options- continue heating pad and ibuprofen -may consider Orilissa or Myfembree    2) Lost IUD -[place] to evaluate location  3) Cervical cancer screening -pap collected  4) Vaginal discharge -vaginitis panel obtained, further management pending results   Orders Placed This Encounter  Procedures   US PELVIC COMPLETE WITH TRANSVAGINAL   RPR  HIV Antibody (routine testing w rflx)    Return for pelvic US at [facility], obtain records from [facility], f/u TBD.   Janyth Pupa, DO Attending [place], [facility] for [employer], [place]

## 2022-05-21 LAB — RPR: RPR Ser Ql: NONREACTIVE

## 2022-05-21 LAB — HIV ANTIBODY (ROUTINE TESTING W REFLEX): HIV Screen 4th Generation wRfx: NONREACTIVE

## 2022-05-24 ENCOUNTER — Ambulatory Visit (HOSPITAL_BASED_OUTPATIENT_CLINIC_OR_DEPARTMENT_OTHER)
Admission: RE | Admit: 2022-05-24 | Discharge: 2022-05-24 | Disposition: A | Discharge status: Home / Self Care | Payer: Medicaid Other | Source: Ambulatory Visit | Attending: Obstetrics & Gynecology | Admitting: Obstetrics & Gynecology

## 2022-05-24 ENCOUNTER — Ambulatory Visit (HOSPITAL_BASED_OUTPATIENT_CLINIC_OR_DEPARTMENT_OTHER): Payer: Medicaid Other

## 2022-05-24 DIAGNOSIS — R102 Pelvic and perineal pain: Secondary | ICD-10-CM | POA: Diagnosis present

## 2022-05-25 LAB — CYTOLOGY - PAP
Chlamydia: NEGATIVE
Comment: NEGATIVE
Comment: NEGATIVE
Comment: NORMAL
Diagnosis: UNDETERMINED — AB
High risk HPV: NEGATIVE
Neisseria Gonorrhea: NEGATIVE

## 2022-05-26 DIAGNOSIS — M546 Pain in thoracic spine: Secondary | ICD-10-CM | POA: Diagnosis not present

## 2022-05-26 DIAGNOSIS — Z6831 Body mass index (BMI) 31.0-31.9, adult: Secondary | ICD-10-CM | POA: Diagnosis not present

## 2022-06-09 ENCOUNTER — Encounter: Payer: Self-pay | Admitting: Obstetrics & Gynecology

## 2022-06-13 ENCOUNTER — Encounter: Payer: Self-pay | Admitting: Neurology

## 2022-06-13 ENCOUNTER — Ambulatory Visit: Payer: Medicaid Other | Admitting: Neurology

## 2022-06-13 VITALS — BP 160/99 | HR 72 | Ht 64.0 in | Wt 182.5 lb

## 2022-06-13 DIAGNOSIS — G8114 Spastic hemiplegia affecting left nondominant side: Secondary | ICD-10-CM

## 2022-06-13 NOTE — Progress Notes (Signed)
 Chief Complaint  Patient presents with   New Patient (Initial Visit)    Rm 15. Alone. NP/paper/Terra Beek NP [facility]/botox for contracture, progressively locking, hx of possible stroke effecting L side.      ASSESSMENT AND PLAN  Grace Sheppard is a 26 y.o. female   Sudden onset of left arm and lower extremity spasticity since March 2023  History of conversion disorder, ongoing depression, social stress, poorly controlled migraine headaches  Normal MRI of the brain, nonrevealing MRI of thoracic spine, and laboratory evaluations,  Her sudden onset left-sided spasticity is likely conversion disorder  Complete evaluation with MRI of cervical spine with without contrast, CPK level,  She was already seen by neurologist at [facility], I will let her know above work-up result, will only return to clinic if we find any structural abnormality, otherwise she will continue follow-up with her previous neurologist,   DIAGNOSTIC DATA (LABS, IMAGING, TESTING) - I reviewed patient records, labs, notes, testing and imaging myself where available.   MEDICAL HISTORY:  Grace Sheppard is a 26-year-old female, seen in request by Ortho clinic nurse practitioner Beek, Terra for evaluation of spastic left upper and lower extremity, her [facility] Morrison, Hayden Byrd, FNP, initial evaluation was on June 13, 2022  I reviewed and summarized the referring note.PMHX Depression Chronic migraine Asthma HTN Chronic insomnia Frequent UTI on chronic antibiotic treatment  Patient reported a history of conversion disorder in the past, in 2014 she presented with sudden onset bilateral lower extremity weakness in the setting of extreme stress, her weakness last about couple months gradually recovered  She suffered long history of migraine headache, gradually getting worse, despite polypharmacy treatment, in early 2023, she has to take FMLA leave 2-3 times  each week from her job at [employer],  In March 2023, she woke up 1 day noticed left arm and leg spasticity, weakness, had difficulty since then, she reported mild improvement, at the beginning she has a lot of left toe curling, now her toes have straightened out,  She was seen by [facility] neurologist already since February 2023, for complaints of memory loss, and the left side symptoms, frequent migraine headaches  MRI of the brain without contrast on January 17, 2022 at [facility] with without contrast showed no acute abnormality,  CT angiogram of head and neck showed no large vessel disease, incidental finding of right thyroid nodule measuring up to 1.9 cm  CT head perfusion study showed no infarcted core or area of ischemia  She was diagnosed with possible conversion disorder, was referred to psychiatrist, is in the process of evaluation  Also personally reviewed MRI of the thoracic spine from July 2023, mild degenerative changes, no evidence of spinal cord compression  Laboratory evaluation showed normal or negative HIV, RPR, CBC, TSH, CMP, low B12, is under B12 supplement, normal C-reactive protein, ESR, A1c  PHYSICAL EXAM:   Vitals:   06/13/22 0819 06/13/22 0822  BP: (!) 152/105 (!) 160/99  Pulse: 73 72  Weight: 182 lb 8 oz (82.8 kg)   Height: 5' 4" (1.626 m)    Not recorded     Body mass index is 31.33 kg/m.  PHYSICAL EXAMNIATION:  Gen: NAD, conversant, well nourised, well groomed                     Cardiovascular: Regular rate rhythm, no peripheral edema, warm, nontender. Eyes: Conjunctivae clear without exudates or hemorrhage   Neck: Supple, no carotid bruits. Pulmonary: Clear to auscultation bilaterally   NEUROLOGICAL EXAM:  MENTAL STATUS: Speech/cognition: Awake, alert, oriented to history taking and casual conversation CRANIAL NERVES: CN II: Visual fields are full to confrontation. Pupils are round equal and briskly reactive to light. CN III, IV, VI:  extraocular movement are normal. No ptosis. CN V: Facial sensation is intact to light touch CN VII: Face is symmetric with normal eye closure  CN VIII: Hearing is normal to causal conversation. CN IX, X: Phonation is normal. CN XI: Head turning and shoulder shrug are intact  MOTOR: Holding left arm against her chest, very difficult to straighten out her left shoulder elbow and fingers with passive stretch, she does not complains of pain, able to regain full range of motion with effort,  There is no left foot ankle plantarflexion, variable effort on examinations,  REFLEXES: Reflex was hypoactive and symmetric bilaterally, toes downgoing bilaterally,  SENSORY: Intact to light touch, pinprick and vibratory sensation are intact in fingers and toes.  No extinction  COORDINATION: There is no trunk or limb dysmetria noted.  GAIT/STANCE: Need push-up to get up from sitting position, rely on her cane, hyperextend her left knee, dragging left foot across the floor,  REVIEW OF SYSTEMS:  Full 14 system review of systems performed and notable only for as above All other review of systems were negative.   ALLERGIES: Allergies  Allergen Reactions   Amoxicillin Rash   Penicillin G Rash    HOME MEDICATIONS: Current Outpatient Medications  Medication Sig Dispense Refill   buPROPion (WELLBUTRIN XL) 150 MG 24 hr tablet Take 150 mg by mouth every morning.     butalbital-acetaminophen-caffeine (FIORICET) 50-325-40 MG tablet Take by mouth every 4 (four) hours as needed.     cyclobenzaprine (FLEXERIL) 10 MG tablet Take 10 mg by mouth 3 (three) times daily as needed.     FLOVENT HFA 44 MCG/ACT inhaler Inhale 2 puffs into the lungs 2 (two) times daily.     fluticasone (FLONASE) 50 MCG/ACT nasal spray Place 2 sprays into both nostrils daily.     gabapentin (NEURONTIN) 300 MG capsule Take 300 mg by mouth 3 (three) times daily.     levonorgestrel (MIRENA) 20 MCG/DAY IUD 1 each by Intrauterine route  once.     LINZESS 145 MCG CAPS capsule Take 145 mcg by mouth daily.     lisinopril (ZESTRIL) 20 MG tablet Take 20 mg by mouth daily.     meloxicam (MOBIC) 15 MG tablet Take 1 tablet (15 mg total) by mouth daily. 30 tablet 1   metoprolol succinate (TOPROL-XL) 25 MG 24 hr tablet Take 25 mg by mouth daily.     nortriptyline (PAMELOR) 25 MG capsule Take 25 mg by mouth at bedtime.     ondansetron (ZOFRAN-ODT) 4 MG disintegrating tablet Take 4 mg by mouth every 8 (eight) hours as needed.     rizatriptan (MAXALT) 5 MG tablet SMARTSIG:1 Tablet(s) By Mouth 1 to 2 Times Daily     Selenium 200 MCG CAPS Take 200 mcg by mouth daily.     traZODone (DESYREL) 50 MG tablet Take 50 mg by mouth at bedtime.     trimethoprim (TRIMPEX) 100 MG tablet Take 100 mg by mouth daily.     Current Facility-Administered Medications  Medication Dose Route Frequency Provider Last Rate Last Admin   betamethasone acetate-betamethasone sodium phosphate (CELESTONE) injection 3 mg  3 mg Intra-articular Once Edrick Kins, DPM  PAST MEDICAL HISTORY: Past Medical History:  Diagnosis Date   Anxiety    Conversion disorder    Neuropathy    Post partum depression    Pseudoseizures    Syncope    recurrent    PAST SURGICAL HISTORY: Past Surgical History:  Procedure Laterality Date   CHOLECYSTECTOMY     TUBAL LIGATION      FAMILY HISTORY: Family History  Problem Relation Age of Onset   Thyroid disease Mother    Thyroid disease Father    Thyroid disease Sister    Thyroid disease Brother    Thyroid disease Brother     SOCIAL HISTORY: Social History   Socioeconomic History   Marital status: Legally Separated    Spouse name: Not on file   Number of children: Not on file   Years of education: Not on file   Highest education level: Not on file  Occupational History   Not on file  Tobacco Use   Smoking status: Former    Packs/day: 0.02    Years: 1.50    Total pack years: 0.03    Types: Cigarettes     Quit date: 12/29/2013    Years since quitting: 8.4   Smokeless tobacco: Never  Vaping Use   Vaping Use: Never used  Substance and Sexual Activity   Alcohol use: No   Drug use: No   Sexual activity: Not on file  Other Topics Concern   Not on file  Social History Narrative   Not on file   Social Determinants of Health   Financial Resource Strain: Not on file  Food Insecurity: Not on file  Transportation Needs: Not on file  Physical Activity: Not on file  Stress: Not on file  Social Connections: Not on file  Intimate Partner Violence: Not on file       , M.D. Ph.D.  [facility] [address], [address] [place], [place] [address] Ph: [phone] Fax: [phone]  CC:  Beek, Terra S, FNP [address] [place],  [place] [address]  Morrison, Hayden Byrd, FNP   

## 2022-06-14 LAB — SEDIMENTATION RATE: Sed Rate: 5 mm/hr (ref 0–32)

## 2022-06-14 LAB — C-REACTIVE PROTEIN: CRP: <1 mg/L (ref 0–10)

## 2022-06-14 LAB — ANA W/REFLEX IF POSITIVE: Anti Nuclear Antibody (ANA): NEGATIVE

## 2022-06-14 LAB — CK: Total CK: 79 U/L (ref 32–182)

## 2022-06-16 ENCOUNTER — Telehealth: Payer: Self-pay | Admitting: Neurology

## 2022-06-16 NOTE — Telephone Encounter (Signed)
[facility] blue Grace Sheppard: [id number] exp. 06/16/2022 - 08/14/2022 sent to [facility]

## 2022-06-24 ENCOUNTER — Ambulatory Visit: Payer: Medicaid Other | Attending: Urology | Admitting: Physical Therapy

## 2022-06-24 ENCOUNTER — Other Ambulatory Visit: Payer: Self-pay

## 2022-06-24 DIAGNOSIS — R293 Abnormal posture: Secondary | ICD-10-CM | POA: Diagnosis present

## 2022-06-24 DIAGNOSIS — R279 Unspecified lack of coordination: Secondary | ICD-10-CM | POA: Insufficient documentation

## 2022-06-24 DIAGNOSIS — M6281 Muscle weakness (generalized): Secondary | ICD-10-CM | POA: Insufficient documentation

## 2022-06-24 DIAGNOSIS — M62838 Other muscle spasm: Secondary | ICD-10-CM | POA: Insufficient documentation

## 2022-06-24 DIAGNOSIS — R269 Unspecified abnormalities of gait and mobility: Secondary | ICD-10-CM | POA: Insufficient documentation

## 2022-06-24 NOTE — Patient Instructions (Addendum)

## 2022-06-24 NOTE — Therapy (Signed)
OUTPATIENT PHYSICAL THERAPY FEMALE PELVIC EVALUATION   Patient Name: Grace Sheppard MRN: [id number] DOB:14-Jan-1996, 26 y.o., female Today's Date: 06/24/2022   PT End of Session - 06/24/22 0804     Visit Number 1    Date for PT Re-Evaluation 09/24/22    Authorization Type [employer]    PT Start Time 0803    PT Stop Time 0846    PT Time Calculation (min) 43 min    Activity Tolerance Patient tolerated treatment well    Behavior During Therapy WFL for tasks assessed/performed             Past Medical History:  Diagnosis Date   Anxiety    Conversion disorder    Neuropathy    Post partum depression    Pseudoseizures    Syncope    recurrent   Past Surgical History:  Procedure Laterality Date   CHOLECYSTECTOMY     TUBAL LIGATION     Patient Active Problem List   Diagnosis Date Noted   Left spastic hemiparesis ([place]) 06/13/2022   Cancer of the bladder, stage IV ([place]) 05/09/2019   Mild persistent asthma with acute exacerbation 04/11/2019   Anxiety and depression 01/01/2018   Conversion reaction 07/15/2016    PCP: Joya Gaskins, FNP  REFERRING PROVIDER: Bjorn Loser, MD  REFERRING DIAG: N39.46 (ICD-10-CM) - Mixed incontinence R35.0 (ICD-10-CM) - Frequency of micturition  THERAPY DIAG:  Muscle weakness (generalized)  Abnormal posture  Abnormality of gait and mobility  Unspecified lack of coordination  Other muscle spasm  Rationale for Evaluation and Treatment Rehabilitation  ONSET DATE: 2-3 years for leakage; constipation for many years  SUBJECTIVE:                                                                                                                                                                                           SUBJECTIVE STATEMENT: Pt reports she has urinary leakage with sneezing/laughing/coughing, sometimes random, urgency and sometimes has leakage en route to bathroom, has history of of catheter use (2 months) due to  retention per pt. Random bladder spasms. chronic constipation with bowel movements usually once per week right now with medication.   Fluid intake: Yes: water, cranberry juice, ginger ale are only drinks     PAIN:  Are you having pain? Yes NPRS scale: 8/10 at worst; 1-2/10 at best  Pain location:  bladder and pelvic pain  Pain type: spasms Pain description: constant   Aggravating factors: throughout the night is worse and into early morning; urge to urinate but unable to empty sometimes Relieving factors: heat sometimes  PRECAUTIONS: None  WEIGHT BEARING RESTRICTIONS  No  FALLS:  Has patient fallen in last 6 months? No  LIVING ENVIRONMENT: Lives with: lives with their family Lives in: House/apartment   OCCUPATION: not currently working, has been working at [employer] but right now unable  PLOF: Needs assistance with ADLs and Needs assistance with homemaking  PATIENT GOALS to have less pain or leakage  PERTINENT HISTORY:  FNS/Conversion Disorder, grade 2 cystocele, mild rectocele, mixed incontinence, mild bedwetting, recurrent UTIs, chronic pelvic pain, possible IC, fibromyalgia, RA, depression, anxiety, seizure disorder, acute gastric ulcer with hemorrhage   Sexual abuse: No  BOWEL MOVEMENT Pain with bowel movement: No Type of bowel movement:Type (Bristol Stool Scale) 1-2, Frequency once per week, and Strain Yes Fully empty rectum: No Leakage: No Pads: No Fiber supplement: Yes: miralax, Linzess, stool softener   URINATION Pain with urination: Yes Fully empty bladder: No Stream: Strong and Weak Urgency: Yes:   Frequency: varies could be every 15 minutes to two hours; nighttime sometimes more than 4x Leakage: Urge to void, Walking to the bathroom, Coughing, Sneezing, Laughing, and Exercise Pads: Yes: changes 4-5 per day  INTERCOURSE Pain with intercourse: Initial Penetration, During Penetration, and After Intercourse Ability to have vaginal penetration:  Yes: but  painful Climax: sometimes painful but able to achieve  Marinoff Scale: 1/3  PREGNANCY Vaginal deliveries 2 Tearing Yes: tearing with first C-section deliveries 0 Currently pregnant No  PROLAPSE Bulge/heaviness fairly constantly    OBJECTIVE:   DIAGNOSTIC FINDINGS:    PATIENT SURVEYS:   PFIQ-7 73  COGNITION:  Overall cognitive status: Within functional limits for tasks assessed     SENSATION:  Light touch: Appears intact  Proprioception: Appears intact  MUSCLE LENGTH: Bil hamstrings and adductors limited by 50%    FUNCTIONAL TESTS:  Difficulty with functional squat with demonstrated forward trunk, bil valgus but worse on Lt, and poor decent ability with slow ascent ability   GAIT: Distance walked: 250' Assistive device utilized: Lobbyist Level of assistance: Modified independence Comments: Lt AFO in place, Lt UE hand/wrist splint, noted decreased trunk rotation and arm swing, decreased step length and height at LT LE, decreased cadence               POSTURE: rounded shoulders, forward head, and posterior pelvic tilt    LUMBARAROM/PROM  A/PROM A/PROM  eval  Flexion Limited by 50%  Extension Limited by 25%  Right lateral flexion Limited by 25%  Left lateral flexion Limited by 25%  Right rotation Limited by 25%  Left rotation Limited by 25%   (Blank rows = not tested)  LOWER EXTREMITY ROM:  Bil WFL LOWER EXTREMITY MMT:  Lt hip 2+/5 in all directions, knee 3+/5; ankle not tested due to AFO in place Rt hip 3+/5 in all directions and knee 4/5, ankle 4+/5   PALPATION:   General  TTP throughout lower abdominal quadrants, worst over bladder and at anterior pelvis                External Perineal Exam TTP at Rt proximal adductor                             Internal Pelvic Floor TTP throughout superficial and deep muscle layers   Patient confirms identification and approves PT to assess internal pelvic floor and treatment Yes  PELVIC MMT:    MMT eval  Vaginal Initial attempts demonstrated heavy pushing/bearing down. Without this pt unable to activate pelvic floor  0/5  Internal Anal Sphincter   External Anal Sphincter   Puborectalis   Diastasis Recti   (Blank rows = not tested)        TONE: Slightly increased   PROLAPSE: Grade 2 anterior wall laxity; grade 1 posterior wall laxity in hooklying (morning appt time) with strong cough  TODAY'S TREATMENT  EVAL Examination completed, findings reviewed, pt educated on POC, bladder irritants, voiding mechanics. Pt motivated to participate in PT and agreeable to attempt recommendations.       If treatment provided at initial evaluation, no treatment charged due to lack of authorization.       PATIENT EDUCATION:  Education details: bladder irritants, voiding mechanics Person educated: Patient Education method: Explanation, Demonstration, Tactile cues, Verbal cues, and Handouts Education comprehension: verbalized understanding and returned demonstration   HOME EXERCISE PROGRAM: bladder irritants, voiding mechanics  ASSESSMENT:  CLINICAL IMPRESSION: Patient is a 26 y.o. female  who was seen today for physical therapy evaluation and treatment for multiple pelvic floor concerns including urinary leakage with stressors and urgency, bladder and pelvis pain and spasms, pain with intercourse, chronic constipation, global weakness, decreased coordination of breathing mechanics and core activation, decreased flexibility at hips and spine. Pt consented to internal vaginal assessment this date and found to have decreased strength, coordination, and endurance and had TTP throughout pelvic floor as well. Pt would benefit from additional PT to further address deficits.       OBJECTIVE IMPAIRMENTS decreased coordination, decreased endurance, decreased mobility, difficulty walking, decreased strength, increased fascial restrictions, increased muscle spasms, impaired flexibility, improper body  mechanics, postural dysfunction, and pain.   ACTIVITY LIMITATIONS carrying, lifting, standing, squatting, continence, and locomotion level  PARTICIPATION LIMITATIONS: cleaning, laundry, interpersonal relationship, community activity, occupation, and yard work  PERSONAL FACTORS Fitness, Time since onset of injury/illness/exacerbation, and 1 comorbidity: medical history   are also affecting patient's functional outcome.   REHAB POTENTIAL: Good  CLINICAL DECISION MAKING: Evolving/moderate complexity  EVALUATION COMPLEXITY: Moderate   GOALS: Goals reviewed with patient? Yes  SHORT TERM GOALS: Target date: 07/22/2022  Pt to be I with HEP.  Baseline: Goal status: INITIAL  2.  Pt will have 25% less urgency due to bladder retraining and strengthening  Baseline:  Goal status: INITIAL  3.  Pt to report improved time between bladder voids to at least 1 hours for improved QOL with decreased urinary frequency.   Baseline:  Goal status: INITIAL  4.  Pt to demonstrate at least 2/5 pelvic floor strength for improved pelvic stability and decreased strain at pelvic floor/ decrease leakage.  Baseline:  Goal status: INITIAL  5.  Pt will report her BMs and bladder voids are more complete at least 50% of the time due to improved bowel habits and evacuation techniques.  Baseline:  Goal status: INITIAL   LONG TERM GOALS: Target date:  09/24/22    Pt to be I with advanced HEP.  Baseline:  Goal status: INITIAL  2.  Pt will have 50% less urgency due to bladder retraining and strengthening  Baseline:  Goal status: INITIAL  3.  Pt to report improved time between bladder voids to at least 2 hours for improved QOL with decreased urinary frequency.   Baseline:  Goal status: INITIAL  4.  Pt to demonstrate at least 3/5 pelvic floor strength for improved pelvic stability and decreased strain at pelvic floor/ decrease leakage.  Baseline:  Goal status: INITIAL  5.  Pt will report her BMs and  bladder voids are  more complete at least 75% of the time due to improved bowel habits and evacuation techniques.  Baseline:  Goal status: INITIAL  6.  Pt to demonstrate improved coordination of breathing mechanics and pelvic floor mobility for squatting 10# without leakage for improved pelvic stability.  Baseline:  Goal status: INITIAL  PLAN: PT FREQUENCY: every other week  PT DURATION:  8 sessions  PLANNED INTERVENTIONS: Therapeutic exercises, Therapeutic activity, Neuromuscular re-education, Patient/Family education, Self Care, Joint mobilization, Aquatic Therapy, Dry Needling, Spinal mobilization, Cryotherapy, Moist heat, scar mobilization, Taping, Biofeedback, and Manual therapy  PLAN FOR NEXT SESSION: manual abdomen, hip and spine stretching, breathing and voiding mechanics, core strengthening, give HEP  Stacy Gardner, PT, DPT 08/25/239:46 AM

## 2022-06-28 ENCOUNTER — Ambulatory Visit: Payer: Medicaid Other | Admitting: Physical Therapy

## 2022-06-28 DIAGNOSIS — R293 Abnormal posture: Secondary | ICD-10-CM

## 2022-06-28 DIAGNOSIS — R279 Unspecified lack of coordination: Secondary | ICD-10-CM

## 2022-06-28 DIAGNOSIS — M6281 Muscle weakness (generalized): Secondary | ICD-10-CM | POA: Diagnosis not present

## 2022-06-28 NOTE — Therapy (Signed)
OUTPATIENT PHYSICAL THERAPY FEMALE PELVIC TREATMENT   Patient Name: Grace Sheppard MRN: [id number] DOB:[birth year], 26 y.o., female Today's Date: 06/28/2022   PT End of Session - 06/28/22 0928     Visit Number 2    Date for PT Re-Evaluation 09/24/22    Authorization Type [employer]    PT Start Time 0930    PT Stop Time 1010    PT Time Calculation (min) 40 min    Activity Tolerance Patient tolerated treatment well    Behavior During Therapy [facility] for tasks assessed/performed             Past Medical History:  Diagnosis Date   Anxiety    Conversion disorder    Neuropathy    Post partum depression    Pseudoseizures    Syncope    recurrent   Past Surgical History:  Procedure Laterality Date   CHOLECYSTECTOMY     TUBAL LIGATION     Patient Active Problem List   Diagnosis Date Noted   Left spastic hemiparesis ([place]) 06/13/2022   Cancer of the bladder, stage IV ([place]) 05/09/2019   Mild persistent asthma with acute exacerbation 04/11/2019   Anxiety and depression 01/01/2018   Conversion reaction 07/15/2016    PCP: Joya Gaskins, FNP  REFERRING PROVIDER: Bjorn Loser, MD  REFERRING DIAG: N39.46 (ICD-10-CM) - Mixed incontinence R35.0 (ICD-10-CM) - Frequency of micturition  THERAPY DIAG:  Muscle weakness (generalized)  Unspecified lack of coordination  Abnormal posture  Rationale for Evaluation and Treatment Rehabilitation  ONSET DATE: 2-3 years for leakage; constipation for many years  SUBJECTIVE:                                                                                                                                                                                           SUBJECTIVE STATEMENT: Pt reports she has been using step stool in bathroom for bowel and bladder voids and reports she has noticed a difference in bowel movement without straining and not for bladder. However states she hasn't had urge to need to push urine since eval either.  Had leakage with inability to make it bathroom in time and sometimes in bed, some random leakage as well, sneezing/laughing/coughing.   Fluid intake: Yes: water, cranberry juice, ginger ale are only drinks     PAIN:  Are you having pain? Yes NPRS scale: 4/10 Pain location:  globally with weather today   PRECAUTIONS: None   PATIENT GOALS to have less pain or leakage  PERTINENT HISTORY:  FNS/Conversion Disorder, grade 2 cystocele, mild rectocele, mixed incontinence, mild bedwetting, recurrent UTIs, chronic pelvic pain, possible IC, fibromyalgia, RA,  depression, anxiety, seizure disorder, acute gastric ulcer with hemorrhage   Sexual abuse: No  BOWEL MOVEMENT Pain with bowel movement: No Type of bowel movement:Type (Bristol Stool Scale) 1-2, Frequency once per week, and Strain Yes Fully empty rectum: No Leakage: No Pads: No Fiber supplement: Yes: miralax, Linzess, stool softener   URINATION Pain with urination: Yes Fully empty bladder: No Stream: Strong and Weak Urgency: Yes:   Frequency: varies could be every 15 minutes to two hours; nighttime sometimes more than 4x Leakage: Urge to void, Walking to the bathroom, Coughing, Sneezing, Laughing, and Exercise Pads: Yes: changes 4-5 per day  INTERCOURSE Pain with intercourse: Initial Penetration, During Penetration, and After Intercourse Ability to have vaginal penetration:  Yes: but painful Climax: sometimes painful but able to achieve  Marinoff Scale: 1/3  PREGNANCY Vaginal deliveries 2 Tearing Yes: tearing with first C-section deliveries 0 Currently pregnant No  PROLAPSE Bulge/heaviness fairly constantly    OBJECTIVE:   DIAGNOSTIC FINDINGS:    PATIENT SURVEYS:   PFIQ-7 9  COGNITION:  Overall cognitive status: Within functional limits for tasks assessed     SENSATION:  Light touch: Appears intact  Proprioception: Appears intact  MUSCLE LENGTH: Bil hamstrings and adductors limited by  50%    FUNCTIONAL TESTS:  Difficulty with functional squat with demonstrated forward trunk, bil valgus but worse on Lt, and poor decent ability with slow ascent ability   GAIT: Distance walked: 250' Assistive device utilized: Lobbyist Level of assistance: Modified independence Comments: Lt AFO in place, Lt UE hand/wrist splint, noted decreased trunk rotation and arm swing, decreased step length and height at LT LE, decreased cadence               POSTURE: rounded shoulders, forward head, and posterior pelvic tilt    LUMBARAROM/PROM  A/PROM A/PROM  eval  Flexion Limited by 50%  Extension Limited by 25%  Right lateral flexion Limited by 25%  Left lateral flexion Limited by 25%  Right rotation Limited by 25%  Left rotation Limited by 25%   (Blank rows = not tested)  LOWER EXTREMITY ROM:  Bil WFL LOWER EXTREMITY MMT:  Lt hip 2+/5 in all directions, knee 3+/5; ankle not tested due to AFO in place Rt hip 3+/5 in all directions and knee 4/5, ankle 4+/5   PALPATION:   General  TTP throughout lower abdominal quadrants, worst over bladder and at anterior pelvis                External Perineal Exam TTP at Rt proximal adductor                             Internal Pelvic Floor TTP throughout superficial and deep muscle layers   Patient confirms identification and approves PT to assess internal pelvic floor and treatment Yes  PELVIC MMT:   MMT eval  Vaginal Initial attempts demonstrated heavy pushing/bearing down. Without this pt unable to activate pelvic floor 0/5  Internal Anal Sphincter   External Anal Sphincter   Puborectalis   Diastasis Recti   (Blank rows = not tested)        TONE: Slightly increased   PROLAPSE: Grade 2 anterior wall laxity; grade 1 posterior wall laxity in hooklying (morning appt time) with strong cough  TODAY'S TREATMENT  06/28/22: Pt educated on prolapse relief positions and HEP Hamstring stretches 3x30s each leg Adductor  stretch 3x30s Single leg happy baby stretch 3x30s each  Hip IR stretch 3x30s TA activations 2x10 in hooklying with moderate cues to activate core    PATIENT EDUCATION:  Education details: prolapse relief positions, HEP Person educated: Patient Education method: Explanation, Demonstration, Tactile cues, Verbal cues, and Handouts Education comprehension: verbalized understanding and returned demonstration   HOME EXERCISE PROGRAM: [id number]  ASSESSMENT:  CLINICAL IMPRESSION: Patient reports similar symptoms to eval. Pt session focused on hip and pelvic floor stretching and initiation of core strengthening with implementation of HEP and this was reviewed with pt. Pt also educated on prolapse relief positions to decrease discomfort with prolapse symptoms at end of day. Pt tolerated well. Pt would benefit from additional PT to further address deficits.       OBJECTIVE IMPAIRMENTS decreased coordination, decreased endurance, decreased mobility, difficulty walking, decreased strength, increased fascial restrictions, increased muscle spasms, impaired flexibility, improper body mechanics, postural dysfunction, and pain.   ACTIVITY LIMITATIONS carrying, lifting, standing, squatting, continence, and locomotion level  PARTICIPATION LIMITATIONS: cleaning, laundry, interpersonal relationship, community activity, occupation, and yard work  PERSONAL FACTORS Fitness, Time since onset of injury/illness/exacerbation, and 1 comorbidity: medical history   are also affecting patient's functional outcome.   REHAB POTENTIAL: Good  CLINICAL DECISION MAKING: Evolving/moderate complexity  EVALUATION COMPLEXITY: Moderate   GOALS: Goals reviewed with patient? Yes  SHORT TERM GOALS: Target date: 07/22/2022  Pt to be I with HEP.  Baseline: Goal status: INITIAL  2.  Pt will have 25% less urgency due to bladder retraining and strengthening  Baseline:  Goal status: INITIAL  3.  Pt to report improved time  between bladder voids to at least 1 hours for improved QOL with decreased urinary frequency.   Baseline:  Goal status: INITIAL  4.  Pt to demonstrate at least 2/5 pelvic floor strength for improved pelvic stability and decreased strain at pelvic floor/ decrease leakage.  Baseline:  Goal status: INITIAL  5.  Pt will report her BMs and bladder voids are more complete at least 50% of the time due to improved bowel habits and evacuation techniques.  Baseline:  Goal status: INITIAL   LONG TERM GOALS: Target date:  09/24/22    Pt to be I with advanced HEP.  Baseline:  Goal status: INITIAL  2.  Pt will have 50% less urgency due to bladder retraining and strengthening  Baseline:  Goal status: INITIAL  3.  Pt to report improved time between bladder voids to at least 2 hours for improved QOL with decreased urinary frequency.   Baseline:  Goal status: INITIAL  4.  Pt to demonstrate at least 3/5 pelvic floor strength for improved pelvic stability and decreased strain at pelvic floor/ decrease leakage.  Baseline:  Goal status: INITIAL  5.  Pt will report her BMs and bladder voids are more complete at least 75% of the time due to improved bowel habits and evacuation techniques.  Baseline:  Goal status: INITIAL  6.  Pt to demonstrate improved coordination of breathing mechanics and pelvic floor mobility for squatting 10# without leakage for improved pelvic stability.  Baseline:  Goal status: INITIAL  PLAN: PT FREQUENCY: every other week  PT DURATION:  8 sessions  PLANNED INTERVENTIONS: Therapeutic exercises, Therapeutic activity, Neuromuscular re-education, Patient/Family education, Self Care, Joint mobilization, Aquatic Therapy, Dry Needling, Spinal mobilization, Cryotherapy, Moist heat, scar mobilization, Taping, Biofeedback, and Manual therapy  PLAN FOR NEXT SESSION: manual abdomen, hip and spine stretching, breathing and voiding mechanics, core strengthening,   Stacy Gardner, PT,  DPT 06/28/2309:10 AM

## 2022-06-29 ENCOUNTER — Other Ambulatory Visit: Payer: Self-pay | Admitting: Obstetrics & Gynecology

## 2022-06-29 DIAGNOSIS — R102 Pelvic and perineal pain: Secondary | ICD-10-CM

## 2022-06-29 MED ORDER — ORILISSA 150 MG PO TABS: 1.0000 | ORAL_TABLET | Freq: Every day | ORAL | 11 refills | Status: AC

## 2022-06-29 NOTE — Progress Notes (Signed)
Prescription sent in for [facility]

## 2022-07-01 ENCOUNTER — Ambulatory Visit (HOSPITAL_COMMUNITY)
Admission: RE | Admit: 2022-07-01 | Discharge: 2022-07-01 | Disposition: A | Discharge status: Home / Self Care | Payer: Medicaid Other | Source: Ambulatory Visit | Attending: Neurology | Admitting: Neurology

## 2022-07-01 DIAGNOSIS — G8114 Spastic hemiplegia affecting left nondominant side: Secondary | ICD-10-CM | POA: Insufficient documentation

## 2022-07-01 MED ORDER — GADOBUTROL 1 MMOL/ML IV SOLN
8.0000 mL | Freq: Once | INTRAVENOUS | Status: AC | PRN
  Administered 2022-07-01: 8 mL via INTRAVENOUS

## 2022-07-06 DIAGNOSIS — F4310 Post-traumatic stress disorder, unspecified: Secondary | ICD-10-CM | POA: Diagnosis not present

## 2022-07-13 DIAGNOSIS — F4310 Post-traumatic stress disorder, unspecified: Secondary | ICD-10-CM | POA: Diagnosis not present

## 2022-07-24 DIAGNOSIS — G8194 Hemiplegia, unspecified affecting left nondominant side: Secondary | ICD-10-CM | POA: Diagnosis not present

## 2022-07-24 DIAGNOSIS — G459 Transient cerebral ischemic attack, unspecified: Secondary | ICD-10-CM | POA: Diagnosis not present

## 2022-07-28 ENCOUNTER — Ambulatory Visit: Payer: Medicaid Other | Attending: Urology | Admitting: Physical Therapy

## 2022-07-28 DIAGNOSIS — M6281 Muscle weakness (generalized): Secondary | ICD-10-CM | POA: Insufficient documentation

## 2022-07-28 DIAGNOSIS — R279 Unspecified lack of coordination: Secondary | ICD-10-CM | POA: Diagnosis present

## 2022-07-28 DIAGNOSIS — R293 Abnormal posture: Secondary | ICD-10-CM | POA: Diagnosis present

## 2022-07-28 NOTE — Therapy (Signed)
OUTPATIENT PHYSICAL THERAPY FEMALE PELVIC TREATMENT   Patient Name: Grace Sheppard MRN: [id number] DOB:Jun 03, 1996, 26 y.o., female Today's Date: 07/28/2022   PT End of Session - 07/28/22 0753     Visit Number 3    Date for PT Re-Evaluation 09/24/22    Authorization Type [employer]    PT Start Time 0800    PT Stop Time 0845    PT Time Calculation (min) 45 min    Activity Tolerance Patient tolerated treatment well    Behavior During Therapy [facility] for tasks assessed/performed             Past Medical History:  Diagnosis Date   Anxiety    Conversion disorder    Neuropathy    Post partum depression    Pseudoseizures    Syncope    recurrent   Past Surgical History:  Procedure Laterality Date   CHOLECYSTECTOMY     TUBAL LIGATION     Patient Active Problem List   Diagnosis Date Noted   Left spastic hemiparesis ([place]) 06/13/2022   Cancer of the bladder, stage IV ([place]) 05/09/2019   Mild persistent asthma with acute exacerbation 04/11/2019   Anxiety and depression 01/01/2018   Conversion reaction 07/15/2016    PCP: Joya Gaskins, FNP  REFERRING PROVIDER: Bjorn Loser, MD  REFERRING DIAG: N39.46 (ICD-10-CM) - Mixed incontinence R35.0 (ICD-10-CM) - Frequency of micturition  THERAPY DIAG:  Muscle weakness (generalized)  Abnormal posture  Unspecified lack of coordination  Rationale for Evaluation and Treatment Rehabilitation  ONSET DATE: 2-3 years for leakage; constipation for many years  SUBJECTIVE:                                                                                                                                                                                           SUBJECTIVE STATEMENT: Pt reports she has started a new medication for leakage but hasn't noticed a difference yet, pain also the same. Pt states she has been doing HEP and it helps with pain sometimes but not consistent. Bowel movements/constipation has been better and less  straining, does use step stool and balloon breathing technique. Miralax and stool softener daily now which has helped a lot too.   Fluid intake: Yes: water, cranberry juice, ginger ale are only drinks   07/28/2022 "pretty much only water now"   PAIN:  Are you having pain? Yes NPRS scale: 3-4/10 Pain location:  globally  Intercourse has been more painful    PRECAUTIONS: None   PATIENT GOALS to have less pain or leakage  PERTINENT HISTORY:  FNS/Conversion Disorder, grade 2 cystocele, mild rectocele, mixed incontinence, mild bedwetting,  recurrent UTIs, chronic pelvic pain, possible IC, fibromyalgia, RA, depression, anxiety, seizure disorder, acute gastric ulcer with hemorrhage   Sexual abuse: No  BOWEL MOVEMENT Pain with bowel movement: No Type of bowel movement:Type (Bristol Stool Scale) 1-2, Frequency once per week, and Strain Yes Fully empty rectum: No Leakage: No Pads: No Fiber supplement: Yes: miralax, Linzess, stool softener   URINATION Pain with urination: Yes Fully empty bladder: No Stream: Strong and Weak Urgency: Yes:   Frequency: varies could be every 15 minutes to two hours; nighttime sometimes more than 4x Leakage: Urge to void, Walking to the bathroom, Coughing, Sneezing, Laughing, and Exercise Pads: Yes: changes 4-5 per day  INTERCOURSE Pain with intercourse: Initial Penetration, During Penetration, and After Intercourse Ability to have vaginal penetration:  Yes: but painful Climax: sometimes painful but able to achieve  Marinoff Scale: 1/3  PREGNANCY Vaginal deliveries 2 Tearing Yes: tearing with first C-section deliveries 0 Currently pregnant No  PROLAPSE Bulge/heaviness fairly constantly    OBJECTIVE:   DIAGNOSTIC FINDINGS:    PATIENT SURVEYS:   PFIQ-7 6  COGNITION:  Overall cognitive status: Within functional limits for tasks assessed     SENSATION:  Light touch: Appears intact  Proprioception: Appears intact  MUSCLE  LENGTH: Bil hamstrings and adductors limited by 50%    FUNCTIONAL TESTS:  Difficulty with functional squat with demonstrated forward trunk, bil valgus but worse on Lt, and poor decent ability with slow ascent ability   GAIT: Distance walked: 250' Assistive device utilized: Lobbyist Level of assistance: Modified independence Comments: Lt AFO in place, Lt UE hand/wrist splint, noted decreased trunk rotation and arm swing, decreased step length and height at LT LE, decreased cadence               POSTURE: rounded shoulders, forward head, and posterior pelvic tilt    LUMBARAROM/PROM  A/PROM A/PROM  eval  Flexion Limited by 50%  Extension Limited by 25%  Right lateral flexion Limited by 25%  Left lateral flexion Limited by 25%  Right rotation Limited by 25%  Left rotation Limited by 25%   (Blank rows = not tested)  LOWER EXTREMITY ROM:  Bil WFL LOWER EXTREMITY MMT:  Lt hip 2+/5 in all directions, knee 3+/5; ankle not tested due to AFO in place Rt hip 3+/5 in all directions and knee 4/5, ankle 4+/5   PALPATION:   General  TTP throughout lower abdominal quadrants, worst over bladder and at anterior pelvis                External Perineal Exam TTP at Rt proximal adductor                             Internal Pelvic Floor TTP throughout superficial and deep muscle layers   Patient confirms identification and approves PT to assess internal pelvic floor and treatment Yes  PELVIC MMT:   MMT eval  Vaginal Initial attempts demonstrated heavy pushing/bearing down. Without this pt unable to activate pelvic floor 0/5  Internal Anal Sphincter   External Anal Sphincter   Puborectalis   Diastasis Recti   (Blank rows = not tested)        TONE: Slightly increased   PROLAPSE: Grade 2 anterior wall laxity; grade 1 posterior wall laxity in hooklying (morning appt time) with strong cough  TODAY'S TREATMENT   07/28/2022: Manual: Pt consented to internal manual  treatment today, found to have Rt side  of pelvic floor at pubococcygeus, obturator internus, iliococcygeus with multiple trigger points noted. All released well, 2 large trigger points at pubococcygeus released mildly but not fully. Pt reports she felt less tight and less pain at end of this treatment.  Therapeutic exercise:  3x30s bil hamstring stretches 3x30s seated happy baby 2x30s butterfly  06/28/22: Pt educated on prolapse relief positions and HEP Hamstring stretches 3x30s each leg Adductor stretch 3x30s Single leg happy baby stretch 3x30s each Hip IR stretch 3x30s TA activations 2x10 in hooklying with moderate cues to activate core    PATIENT EDUCATION:  Education details: prolapse relief positions, HEP Person educated: Patient Education method: Explanation, Demonstration, Tactile cues, Verbal cues, and Handouts Education comprehension: verbalized understanding and returned demonstration   HOME EXERCISE PROGRAM: [id number]  ASSESSMENT:  CLINICAL IMPRESSION: Patient reports she had similar symptoms to PT. Pt session focused on internal manual work at pelvic floor treatment, improved symptoms at end of session. Pt session ended with stretching to improve mobility and decrease muscle tension at proximal hips and pelvic floor. Pt tolerated well. Pt would benefit from additional PT to further address deficits.       OBJECTIVE IMPAIRMENTS decreased coordination, decreased endurance, decreased mobility, difficulty walking, decreased strength, increased fascial restrictions, increased muscle spasms, impaired flexibility, improper body mechanics, postural dysfunction, and pain.   ACTIVITY LIMITATIONS carrying, lifting, standing, squatting, continence, and locomotion level  PARTICIPATION LIMITATIONS: cleaning, laundry, interpersonal relationship, community activity, occupation, and yard work  PERSONAL FACTORS Fitness, Time since onset of injury/illness/exacerbation, and 1 comorbidity:  medical history   are also affecting patient's functional outcome.   REHAB POTENTIAL: Good  CLINICAL DECISION MAKING: Evolving/moderate complexity  EVALUATION COMPLEXITY: Moderate   GOALS: Goals reviewed with patient? Yes  SHORT TERM GOALS: Target date: 07/22/2022  Pt to be I with HEP.  Baseline: Goal status: INITIAL  2.  Pt will have 25% less urgency due to bladder retraining and strengthening  Baseline:  Goal status: INITIAL  3.  Pt to report improved time between bladder voids to at least 1 hours for improved QOL with decreased urinary frequency.   Baseline:  Goal status: INITIAL  4.  Pt to demonstrate at least 2/5 pelvic floor strength for improved pelvic stability and decreased strain at pelvic floor/ decrease leakage.  Baseline:  Goal status: INITIAL  5.  Pt will report her BMs and bladder voids are more complete at least 50% of the time due to improved bowel habits and evacuation techniques.  Baseline:  Goal status: INITIAL   LONG TERM GOALS: Target date:  09/24/22    Pt to be I with advanced HEP.  Baseline:  Goal status: INITIAL  2.  Pt will have 50% less urgency due to bladder retraining and strengthening  Baseline:  Goal status: INITIAL  3.  Pt to report improved time between bladder voids to at least 2 hours for improved QOL with decreased urinary frequency.   Baseline:  Goal status: INITIAL  4.  Pt to demonstrate at least 3/5 pelvic floor strength for improved pelvic stability and decreased strain at pelvic floor/ decrease leakage.  Baseline:  Goal status: INITIAL  5.  Pt will report her BMs and bladder voids are more complete at least 75% of the time due to improved bowel habits and evacuation techniques.  Baseline:  Goal status: INITIAL  6.  Pt to demonstrate improved coordination of breathing mechanics and pelvic floor mobility for squatting 10# without leakage for improved pelvic stability.  Baseline:  Goal  status: INITIAL  PLAN: PT  FREQUENCY: every other week  PT DURATION:  8 sessions  PLANNED INTERVENTIONS: Therapeutic exercises, Therapeutic activity, Neuromuscular re-education, Patient/Family education, Self Care, Joint mobilization, Aquatic Therapy, Dry Needling, Spinal mobilization, Cryotherapy, Moist heat, scar mobilization, Taping, Biofeedback, and Manual therapy  PLAN FOR NEXT SESSION: manual abdomen, hip and spine stretching, breathing and voiding mechanics, core strengthening,   Stacy Gardner, PT, DPT 09/28/239:19 AM

## 2022-07-29 NOTE — Progress Notes (Unsigned)
Office Visit Note  Patient: Grace Sheppard             Date of Birth: [birth year]           MRN: [id number]             PCP: Joya Gaskins, FNP Referring: Marye Round, FNP Visit Date: 08/04/2022 Occupation: '@GUAROCC' @  Subjective:  Pain in multiple joints and muscles  History of Present Illness: Grace Sheppard is a 26 y.o. female in consultation per request of her PCP.  According to the patient she has had a very stressful childhood.  She had conversion disorder since she was a child.  She was diagnosed with PTSD and anxiety and depression at an early age.  She states about 3 years ago she started having increased muscle pain and fatigue.  She also notices discoloration in her hands and feet and all of her joints are painful.  She notices intermittent swelling in her hands and her knee joints.  She complains of pain in almost all of her joints.  She also has thoracic and lumbar spine discomfort.  She goes to a spine specialist.  She has had cortisone injections in the past which have not been effective.  She states she was initially seen by her primary care physician who felt that she had some form of arthritis.  She was later diagnosed with fibromyalgia syndrome.  She has tried meloxicam, gabapentin and Flexeril combination which helps to some extent but does not get rid of her pain completely.  She also takes trazodone but despite of that she has insomnia.  She states she is in constant pain.  She rates her pain anywhere from 3-8 on the scale of 0-10.  Today she reports her pain about 6-7.  She reports a stiffness in her hands.  She also feels that she has intermittent low-grade fevers.  She gives history of rash on her face and intermittent oral ulcers.  No family history of autoimmune disease.  He is gravida 2, para 2, miscarriages 0.  There is no history of preeclampsia or complications during pregnancy.  Activities of Daily Living:  Patient reports morning stiffness for several hours.    Patient Reports nocturnal pain.  Difficulty dressing/grooming: Reports Difficulty climbing stairs: Reports Difficulty getting out of chair: Reports Difficulty using hands for taps, buttons, cutlery, and/or writing: Reports  Review of Systems  Constitutional:  Positive for fatigue.  HENT:  Positive for mouth sores and mouth dryness.   Eyes:  Negative for dryness.  Respiratory:  Positive for shortness of breath.        H/o asthma  Cardiovascular:  Positive for chest pain and palpitations.  Gastrointestinal:  Positive for constipation. Negative for blood in stool and diarrhea.  Endocrine: Positive for increased urination.  Genitourinary:  Positive for involuntary urination.  Musculoskeletal:  Positive for joint pain, gait problem, joint pain, joint swelling, myalgias, muscle weakness, morning stiffness, muscle tenderness and myalgias.  Skin:  Positive for color change, rash and sensitivity to sunlight. Negative for hair loss.  Allergic/Immunologic: Positive for susceptible to infections.  Neurological:  Positive for dizziness and headaches.  Hematological:  Negative for swollen glands.  Psychiatric/Behavioral:  Positive for depressed mood and sleep disturbance. The patient is nervous/anxious.     PMFS History:  Patient Active Problem List   Diagnosis Date Noted   Left spastic hemiparesis (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[place]) 05/09/2019   Mild persistent asthma  with acute exacerbation 04/11/2019   Anxiety and depression 01/01/2018   Conversion reaction 07/15/2016    Past Medical History:  Diagnosis Date   Anxiety    Conversion disorder    Neuropathy    Post partum depression    Pseudoseizures    Syncope    recurrent    Family History  Problem Relation Age of Onset   Thyroid disease Mother    Diabetes Mother    Hypertension Mother    Hypertension Father    Thyroid disease Father    Thyroid disease Sister    Thyroid disease Brother    Thyroid disease  Brother    Past Surgical History:  Procedure Laterality Date   CHOLECYSTECTOMY     TUBAL LIGATION     Social History   Social History Narrative   Not on file   Immunization History  Administered Date(s) Administered   Moderna Sars-Covid-2 Vaccination 01/25/2020, 02/22/2020   Tdap 03/06/2015, 03/06/2015     Objective: Vital Signs: BP (!) 184/124 (BP Location: Right Arm, Patient Position: Sitting, Cuff Size: Normal)   Pulse 86   Resp 16   Ht '5\' 4"'  (1.626 m)   Wt 195 lb 3.2 oz (88.5 kg)   BMI 33.51 kg/m    Physical Exam Vitals and nursing note reviewed.  Constitutional:      Appearance: She is well-developed.  HENT:     Head: Normocephalic and atraumatic.  Eyes:     Conjunctiva/sclera: Conjunctivae normal.  Cardiovascular:     Rate and Rhythm: Normal rate and regular rhythm.     Heart sounds: Normal heart sounds.  Pulmonary:     Effort: Pulmonary effort is normal.     Breath sounds: Normal breath sounds.  Abdominal:     General: Bowel sounds are normal.     Palpations: Abdomen is soft.  Musculoskeletal:     Cervical back: Normal range of motion.  Lymphadenopathy:     Cervical: No cervical adenopathy.  Skin:    General: Skin is warm and dry.     Capillary Refill: Capillary refill takes less than 2 seconds.  Neurological:     Mental Status: She is alert and oriented to person, place, and time.  Psychiatric:        Behavior: Behavior normal.      Musculoskeletal Exam: Cervical spine was in good range of motion.  She had bilateral trapezius spasm.  She had no point tenderness over the thoracic or lumbar spine but had tenderness in the gluteal region.  She had discomfort with range of motion of her lumbar spine.  Right shoulder joint, elbow, wrist joint, MCPs PIPs and DIPs been good range of motion.  She was unable to move her left arm.  She was in a brace.  Hip joints and knee joints with good range of motion.  Left ankle was in the AFO brace.  Right ankle had no  tenderness.  There was no tenderness over MTPs.  No synovitis was noted on the examination.  She had generalized hyperalgesia and positive tender points.  She walks with the help of a cane.  CDAI Exam: CDAI Score: -- Patient Global: --; Provider Global: -- Swollen: --; Tender: -- Joint Exam 08/04/2022   No joint exam has been documented for this visit   There is currently no information documented on the homunculus. Go to the Rheumatology activity and complete the homunculus joint exam.  Investigation: No additional findings.  Imaging: No results found.  Recent Labs: Lab  Results  Component Value Date   WBC 11.1 (H) 01/01/2018   HGB 15.2 01/01/2018   PLT 372 01/01/2018   NA 143 01/01/2018   K 3.9 01/01/2018   CL 100 01/01/2018   CO2 23 01/01/2018   GLUCOSE 84 01/01/2018   BUN 11 01/01/2018   CREATININE 0.72 01/01/2018   BILITOT 0.2 01/01/2018   ALKPHOS 67 01/01/2018   AST 16 01/01/2018   ALT 15 01/01/2018   PROT 8.1 01/01/2018   ALBUMIN 5.4 01/01/2018   CALCIUM 10.3 (H) 01/01/2018   GFRAA 136 01/01/2018   May 02, 2022 vitamin D 33, B12 186.  CBC with differential normal, BMP normal, TSH normal, February 21, 2022 CMP normal, UA nonspecific  Speciality Comments: No specialty comments available.  Procedures:  No procedures performed Allergies: Amoxicillin and Penicillin g   Assessment / Plan:     Visit Diagnoses: Myalgia -patient gives history of pain in all of her muscles.  She states she lives in constant pain on the scale of 0-10 anywhere from 3-8.  She has been diagnosed with fibromyalgia syndrome in the past.  She states she does get relief from gabapentin, meloxicam and cyclobenzaprine combination.  She has been going to physical therapy.  She had generalized hyperalgesia and positive tender points consistent with fibromyalgia syndrome.  06/13/22: ANA negative, ESR 5, CK 79, CRP<1  Chronic fatigue-she gives history of chronic fatigue most likely related to  fibromyalgia.  Pain in both hands -she complains of pain and discomfort in her bilateral hands and intermittent swelling.  No synovitis was noted.  ANA was negative and sed rate and C-reactive protein were normal.  I will obtain rheumatoid factor and anti-CCP today.  Plan: XR Hand 2 View Right, x-rays of the right hand were within normal limits.  X-ray findings were discussed with the patient.  Rheumatoid factor, Cyclic citrul peptide antibody, IgG.  I will call her back with the results.  I do not suspect that she has rheumatoid arthritis based on the examination and the x-ray findings.  I advised her to contact us if she develops any swelling.  Otherwise we will see her only on as needed basis.  Pain in both feet -she complains of pain and discomfort in her bilateral feet.  She had no tenderness over ankles or MTPs.  Plan: XR Foot 2 Views Right.  X-rays of the foot were unremarkable.  X-ray findings were discussed with the patient.  Trapezius muscle spasm-she had bilateral trapezius spasm which causes discomfort.  Pain in thoracic spine-she complains of discomfort in her thoracic region.  She states she goes to a spine specialist.  Chronic midline low back pain without sciatica-she can history of chronic lower back pain.  She goes to a spine specialist.  She states she has had cortisone injections in the past which did not help much.  Essential hypertension-her blood pressure was 184/124 in the office.  Patient states her blood pressure has been running in the 940H systolic and 680S diastolic at home.  Patient states she has been in constant touch with her PCP.  We reached out to her PCPs office.  They will see her this afternoon.  Patient was informed and she is aware about her appointment this afternoon with her PCP.  History of asthma-history of intermittent shortness of breath related to asthma.  Anxiety and depression-she has longstanding history of anxiety and depression.  She is on  medications.  Left spastic hemiparesis ([place]) - Since March 2023, followed by  Dr. Krista Blue.  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  Patient states she has had symptoms of conversion disorder since she was a child.  She had extensive work-up by Dr. Krista Blue.  B12 deficiency-she is on 12 shots.  Memory loss-patient states that she has memory issues and has been going to memory care.  Orders: Orders Placed This Encounter  Procedures   XR Hand 2 View Right   XR Foot 2 Views Right   Rheumatoid factor   Cyclic citrul peptide antibody, IgG   No orders of the defined types were placed in this encounter.    Follow-Up Instructions: Return if symptoms worsen or fail to improve, for Myalgia, arthralgia.   Bo Merino, MD  Note - This record has been created using Editor, commissioning.  Chart creation errors have been sought, but may not always  have been located. Such creation errors do not reflect on  the standard of medical care.

## 2022-08-04 ENCOUNTER — Ambulatory Visit: Payer: Medicaid Other | Attending: Rheumatology | Admitting: Rheumatology

## 2022-08-04 ENCOUNTER — Ambulatory Visit (INDEPENDENT_AMBULATORY_CARE_PROVIDER_SITE_OTHER): Payer: Medicaid Other

## 2022-08-04 ENCOUNTER — Encounter: Payer: Self-pay | Admitting: Rheumatology

## 2022-08-04 ENCOUNTER — Telehealth: Payer: Self-pay

## 2022-08-04 ENCOUNTER — Telehealth: Payer: Self-pay | Admitting: *Deleted

## 2022-08-04 VITALS — BP 184/124 | HR 86 | Resp 16 | Ht 64.0 in | Wt 195.2 lb

## 2022-08-04 DIAGNOSIS — M79641 Pain in right hand: Secondary | ICD-10-CM

## 2022-08-04 DIAGNOSIS — M79671 Pain in right foot: Secondary | ICD-10-CM

## 2022-08-04 DIAGNOSIS — E538 Deficiency of other specified B group vitamins: Secondary | ICD-10-CM

## 2022-08-04 DIAGNOSIS — M545 Low back pain, unspecified: Secondary | ICD-10-CM

## 2022-08-04 DIAGNOSIS — C679 Malignant neoplasm of bladder, unspecified: Secondary | ICD-10-CM

## 2022-08-04 DIAGNOSIS — R7989 Other specified abnormal findings of blood chemistry: Secondary | ICD-10-CM

## 2022-08-04 DIAGNOSIS — M791 Myalgia, unspecified site: Secondary | ICD-10-CM

## 2022-08-04 DIAGNOSIS — F449 Dissociative and conversion disorder, unspecified: Secondary | ICD-10-CM

## 2022-08-04 DIAGNOSIS — F32A Depression, unspecified: Secondary | ICD-10-CM

## 2022-08-04 DIAGNOSIS — M79672 Pain in left foot: Secondary | ICD-10-CM

## 2022-08-04 DIAGNOSIS — Z8709 Personal history of other diseases of the respiratory system: Secondary | ICD-10-CM

## 2022-08-04 DIAGNOSIS — M79642 Pain in left hand: Secondary | ICD-10-CM

## 2022-08-04 DIAGNOSIS — G8929 Other chronic pain: Secondary | ICD-10-CM

## 2022-08-04 DIAGNOSIS — R5382 Chronic fatigue, unspecified: Secondary | ICD-10-CM | POA: Diagnosis not present

## 2022-08-04 DIAGNOSIS — M62838 Other muscle spasm: Secondary | ICD-10-CM

## 2022-08-04 DIAGNOSIS — G8114 Spastic hemiplegia affecting left nondominant side: Secondary | ICD-10-CM

## 2022-08-04 DIAGNOSIS — M546 Pain in thoracic spine: Secondary | ICD-10-CM

## 2022-08-04 DIAGNOSIS — F419 Anxiety disorder, unspecified: Secondary | ICD-10-CM

## 2022-08-04 DIAGNOSIS — R413 Other amnesia: Secondary | ICD-10-CM

## 2022-08-04 DIAGNOSIS — I10 Essential (primary) hypertension: Secondary | ICD-10-CM

## 2022-08-04 NOTE — Telephone Encounter (Signed)
Per Dr. Estanislado Pandy, I reached out to patient's PCP Patrecia Pace, FNP due to elevated blood pressure reading in office today. Patient is due to follow up with Martie Round on 08/12/2022. Patient's blood pressure reading today was 184/124. Patient reports she has been checking her blood pressure at home and they have been elevated as well. Spoke with Danae Chen to see if they would like patient to go to the emergency room or if they can see the patient today. Danae Chen advised they would be able to see the patient this afternoon. Danae Chen states they have a new system and she will have to have the front desk reach out to the patient with an exact time. Patient advised to be on the lookout for a phone call from their office to schedule an appointment for this afternoon. Patient expressed understanding.

## 2022-08-04 NOTE — Telephone Encounter (Signed)
Please call patient with lab & x-ray results, per Dr. Estanislado Pandy. No follow up is needed. Thanks!

## 2022-08-05 ENCOUNTER — Encounter: Payer: Self-pay | Admitting: Rheumatology

## 2022-08-05 ENCOUNTER — Encounter: Payer: Self-pay | Admitting: Neurology

## 2022-08-05 LAB — CYCLIC CITRUL PEPTIDE ANTIBODY, IGG: Cyclic Citrullin Peptide Ab: <16 UNITS

## 2022-08-05 LAB — RHEUMATOID FACTOR: Rheumatoid fact SerPl-aCnc: <14 IU/mL (ref <14)

## 2022-08-05 NOTE — Telephone Encounter (Signed)
Disability paperwork should be filled by her PCP or neurologist.  We only did initial work-up and will not be able to do any disability paperwork.

## 2022-08-07 NOTE — Progress Notes (Signed)
Rheumatoid factor is negative, anti-CCP is negative.  Patient follow-up appointment was canceled.  Patient will return on as needed basis if she develops any new symptoms.  Please forward lab results to her PCP.

## 2022-08-08 NOTE — Telephone Encounter (Signed)
Patient advised of x-ray results and labs.

## 2022-08-10 ENCOUNTER — Ambulatory Visit: Payer: Medicaid Other | Attending: Urology | Admitting: Physical Therapy

## 2022-08-10 DIAGNOSIS — R293 Abnormal posture: Secondary | ICD-10-CM | POA: Insufficient documentation

## 2022-08-10 DIAGNOSIS — R531 Weakness: Secondary | ICD-10-CM | POA: Diagnosis not present

## 2022-08-10 DIAGNOSIS — R279 Unspecified lack of coordination: Secondary | ICD-10-CM | POA: Diagnosis present

## 2022-08-10 DIAGNOSIS — M6281 Muscle weakness (generalized): Secondary | ICD-10-CM | POA: Diagnosis not present

## 2022-08-10 DIAGNOSIS — R262 Difficulty in walking, not elsewhere classified: Secondary | ICD-10-CM | POA: Diagnosis not present

## 2022-08-10 DIAGNOSIS — F449 Dissociative and conversion disorder, unspecified: Secondary | ICD-10-CM | POA: Diagnosis not present

## 2022-08-10 NOTE — Patient Instructions (Signed)

## 2022-08-10 NOTE — Therapy (Signed)
OUTPATIENT PHYSICAL THERAPY FEMALE PELVIC TREATMENT   Patient Name: Grace Sheppard MRN: [id number] DOB:[birth year], 26 y.o., female Today's Date: 08/10/2022   PT End of Session - 08/10/22 0754     Visit Number 4    Date for PT Re-Evaluation 09/24/22    Authorization Type [employer]    PT Start Time 0800    PT Stop Time 0838    PT Time Calculation (min) 38 min    Activity Tolerance Patient tolerated treatment well    Behavior During Therapy [facility] for tasks assessed/performed             Past Medical History:  Diagnosis Date   Anxiety    Conversion disorder    Neuropathy    Post partum depression    Pseudoseizures    Syncope    recurrent   Past Surgical History:  Procedure Laterality Date   CHOLECYSTECTOMY     TUBAL LIGATION     Patient Active Problem List   Diagnosis Date Noted   Left spastic hemiparesis ([place]) 06/13/2022   Cancer of the bladder, stage IV ([place]) 05/09/2019   Mild persistent asthma with acute exacerbation 04/11/2019   Anxiety and depression 01/01/2018   Conversion reaction 07/15/2016    PCP: Joya Gaskins, FNP  REFERRING PROVIDER: Bjorn Loser, MD  REFERRING DIAG: N39.46 (ICD-10-CM) - Mixed incontinence R35.0 (ICD-10-CM) - Frequency of micturition  THERAPY DIAG:  Muscle weakness (generalized)  Unspecified lack of coordination  Abnormal posture  Rationale for Evaluation and Treatment Rehabilitation  ONSET DATE: 2-3 years for leakage; constipation for many years  SUBJECTIVE:                                                                                                                                                                                           SUBJECTIVE STATEMENT: Pt reports she felt relief after last session for about 2 days but then pressure returned.   Fluid intake: Yes: water, cranberry juice, ginger ale are only drinks   07/28/2022 "pretty much only water now"   PAIN:  Are you having pain? Yes NPRS  scale: 3-4/10 Pain location:  globally  Intercourse has been more painful    PRECAUTIONS: None   PATIENT GOALS to have less pain or leakage  PERTINENT HISTORY:  FNS/Conversion Disorder, grade 2 cystocele, mild rectocele, mixed incontinence, mild bedwetting, recurrent UTIs, chronic pelvic pain, possible IC, fibromyalgia, RA, depression, anxiety, seizure disorder, acute gastric ulcer with hemorrhage   Sexual abuse: No  BOWEL MOVEMENT Pain with bowel movement: No Type of bowel movement:Type (Bristol Stool Scale) 1-2, Frequency once per week, and Strain Yes Fully empty  rectum: No Leakage: No Pads: No Fiber supplement: Yes: miralax, Linzess, stool softener   URINATION Pain with urination: Yes Fully empty bladder: No Stream: Strong and Weak Urgency: Yes:   Frequency: varies could be every 15 minutes to two hours; nighttime sometimes more than 4x Leakage: Urge to void, Walking to the bathroom, Coughing, Sneezing, Laughing, and Exercise Pads: Yes: changes 4-5 per day  INTERCOURSE Pain with intercourse: Initial Penetration, During Penetration, and After Intercourse Ability to have vaginal penetration:  Yes: but painful Climax: sometimes painful but able to achieve  Marinoff Scale: 1/3  PREGNANCY Vaginal deliveries 2 Tearing Yes: tearing with first C-section deliveries 0 Currently pregnant No  PROLAPSE Bulge/heaviness fairly constantly    OBJECTIVE:   DIAGNOSTIC FINDINGS:    PATIENT SURVEYS:   PFIQ-7 72  COGNITION:  Overall cognitive status: Within functional limits for tasks assessed     SENSATION:  Light touch: Appears intact  Proprioception: Appears intact  MUSCLE LENGTH: Bil hamstrings and adductors limited by 50%    FUNCTIONAL TESTS:  Difficulty with functional squat with demonstrated forward trunk, bil valgus but worse on Lt, and poor decent ability with slow ascent ability   GAIT: Distance walked: 250' Assistive device utilized: Investment banker, operational Level of assistance: Modified independence Comments: Lt AFO in place, Lt UE hand/wrist splint, noted decreased trunk rotation and arm swing, decreased step length and height at LT LE, decreased cadence               POSTURE: rounded shoulders, forward head, and posterior pelvic tilt    LUMBARAROM/PROM  A/PROM A/PROM  eval  Flexion Limited by 50%  Extension Limited by 25%  Right lateral flexion Limited by 25%  Left lateral flexion Limited by 25%  Right rotation Limited by 25%  Left rotation Limited by 25%   (Blank rows = not tested)  LOWER EXTREMITY ROM:  Bil WFL LOWER EXTREMITY MMT:  Lt hip 2+/5 in all directions, knee 3+/5; ankle not tested due to AFO in place Rt hip 3+/5 in all directions and knee 4/5, ankle 4+/5   PALPATION:   General  TTP throughout lower abdominal quadrants, worst over bladder and at anterior pelvis                External Perineal Exam TTP at Rt proximal adductor                             Internal Pelvic Floor TTP throughout superficial and deep muscle layers   Patient confirms identification and approves PT to assess internal pelvic floor and treatment Yes  PELVIC MMT:   MMT eval  Vaginal Initial attempts demonstrated heavy pushing/bearing down. Without this pt unable to activate pelvic floor 0/5  Internal Anal Sphincter   External Anal Sphincter   Puborectalis   Diastasis Recti   (Blank rows = not tested)        TONE: Slightly increased   PROLAPSE: Grade 2 anterior wall laxity; grade 1 posterior wall laxity in hooklying (morning appt time) with strong cough  TODAY'S TREATMENT   08/10/22: Self care: Reviewed prolapse relief positions, urge drill, pelvic wand NMRE: all exercises cued for breathing mechanics and core activation  Bridges 2x10 Marching blue loop 2x10 Hip abduction blue loop 2x10 Prone glute squeezes x20  07/28/2022: Manual: Pt consented to internal manual treatment today, found to have Rt side of pelvic floor  at pubococcygeus, obturator internus, iliococcygeus with multiple  trigger points noted. All released well, 2 large trigger points at pubococcygeus released mildly but not fully. Pt reports she felt less tight and less pain at end of this treatment.  Therapeutic exercise:  3x30s bil hamstring stretches 3x30s seated happy baby 2x30s butterfly  06/28/22: Pt educated on prolapse relief positions and HEP Hamstring stretches 3x30s each leg Adductor stretch 3x30s Single leg happy baby stretch 3x30s each Hip IR stretch 3x30s TA activations 2x10 in hooklying with moderate cues to activate core    PATIENT EDUCATION:  Education details: prolapse relief positions, HEP Person educated: Patient Education method: Consulting civil engineer, Demonstration, Tactile cues, Verbal cues, and Handouts Education comprehension: verbalized understanding and returned demonstration   HOME EXERCISE PROGRAM: [id number]  ASSESSMENT:  CLINICAL IMPRESSION: Patient reports she does still feel prolapse symptoms but noted they were greatly improved and had relief after last session for about 2 days then pain and prolapse symptoms began to return. Pt session focused on gentle beginner strengthening of hips and core, pt declined internal today. Pt benefited from cues throughout for breathing mechanics to prevent holding breath and reported this helped limit pressure feeling. Pt tolerated well. Pt would benefit from additional PT to further address deficits.       OBJECTIVE IMPAIRMENTS decreased coordination, decreased endurance, decreased mobility, difficulty walking, decreased strength, increased fascial restrictions, increased muscle spasms, impaired flexibility, improper body mechanics, postural dysfunction, and pain.   ACTIVITY LIMITATIONS carrying, lifting, standing, squatting, continence, and locomotion level  PARTICIPATION LIMITATIONS: cleaning, laundry, interpersonal relationship, community activity, occupation, and yard  work  PERSONAL FACTORS Fitness, Time since onset of injury/illness/exacerbation, and 1 comorbidity: medical history   are also affecting patient's functional outcome.   REHAB POTENTIAL: Good  CLINICAL DECISION MAKING: Evolving/moderate complexity  EVALUATION COMPLEXITY: Moderate   GOALS: Goals reviewed with patient? Yes  SHORT TERM GOALS: Target date: 07/22/2022  Pt to be I with HEP.  Baseline: Goal status: INITIAL  2.  Pt will have 25% less urgency due to bladder retraining and strengthening  Baseline:  Goal status: INITIAL  3.  Pt to report improved time between bladder voids to at least 1 hours for improved QOL with decreased urinary frequency.   Baseline:  Goal status: INITIAL  4.  Pt to demonstrate at least 2/5 pelvic floor strength for improved pelvic stability and decreased strain at pelvic floor/ decrease leakage.  Baseline:  Goal status: INITIAL  5.  Pt will report her BMs and bladder voids are more complete at least 50% of the time due to improved bowel habits and evacuation techniques.  Baseline:  Goal status: INITIAL   LONG TERM GOALS: Target date:  09/24/22    Pt to be I with advanced HEP.  Baseline:  Goal status: INITIAL  2.  Pt will have 50% less urgency due to bladder retraining and strengthening  Baseline:  Goal status: INITIAL  3.  Pt to report improved time between bladder voids to at least 2 hours for improved QOL with decreased urinary frequency.   Baseline:  Goal status: INITIAL  4.  Pt to demonstrate at least 3/5 pelvic floor strength for improved pelvic stability and decreased strain at pelvic floor/ decrease leakage.  Baseline:  Goal status: INITIAL  5.  Pt will report her BMs and bladder voids are more complete at least 75% of the time due to improved bowel habits and evacuation techniques.  Baseline:  Goal status: INITIAL  6.  Pt to demonstrate improved coordination of breathing mechanics and pelvic floor mobility  for squatting 10#  without leakage for improved pelvic stability.  Baseline:  Goal status: INITIAL  PLAN: PT FREQUENCY: every other week  PT DURATION:  8 sessions  PLANNED INTERVENTIONS: Therapeutic exercises, Therapeutic activity, Neuromuscular re-education, Patient/Family education, Self Care, Joint mobilization, Aquatic Therapy, Dry Needling, Spinal mobilization, Cryotherapy, Moist heat, scar mobilization, Taping, Biofeedback, and Manual therapy  PLAN FOR NEXT SESSION: manual abdomen, hip and spine stretching, breathing and voiding mechanics, core strengthening,   Stacy Gardner, PT, DPT 10/11/238:40 AM

## 2022-08-11 DIAGNOSIS — F4310 Post-traumatic stress disorder, unspecified: Secondary | ICD-10-CM | POA: Diagnosis not present

## 2022-08-17 DIAGNOSIS — R531 Weakness: Secondary | ICD-10-CM | POA: Diagnosis not present

## 2022-08-17 DIAGNOSIS — R262 Difficulty in walking, not elsewhere classified: Secondary | ICD-10-CM | POA: Diagnosis not present

## 2022-08-17 DIAGNOSIS — F449 Dissociative and conversion disorder, unspecified: Secondary | ICD-10-CM | POA: Diagnosis not present

## 2022-08-23 DIAGNOSIS — G459 Transient cerebral ischemic attack, unspecified: Secondary | ICD-10-CM | POA: Diagnosis not present

## 2022-08-23 DIAGNOSIS — G8194 Hemiplegia, unspecified affecting left nondominant side: Secondary | ICD-10-CM | POA: Diagnosis not present

## 2022-08-25 ENCOUNTER — Encounter: Payer: Medicaid Other | Admitting: Physical Therapy

## 2022-08-25 DIAGNOSIS — N3946 Mixed incontinence: Secondary | ICD-10-CM | POA: Diagnosis not present

## 2022-08-26 ENCOUNTER — Ambulatory Visit: Payer: Medicaid Other | Admitting: Rheumatology

## 2022-08-31 DIAGNOSIS — F449 Dissociative and conversion disorder, unspecified: Secondary | ICD-10-CM | POA: Diagnosis not present

## 2022-08-31 DIAGNOSIS — R262 Difficulty in walking, not elsewhere classified: Secondary | ICD-10-CM | POA: Diagnosis not present

## 2022-08-31 DIAGNOSIS — R531 Weakness: Secondary | ICD-10-CM | POA: Diagnosis not present

## 2022-09-07 IMAGING — US US EXTREM LOW VENOUS*L*
1 series · 14 of 24 positions shown · non-contrast
Comparison: Pelvic ultrasound, 03/03/2021.

CLINICAL DATA: LEFT leg swelling.

EXAM:
LEFT LOWER EXTREMITY VENOUS DOPPLER ULTRASOUND
TECHNIQUE: Gray-scale sonography with compression, as well as color and duplex
ultrasound, were performed to evaluate the deep venous system(s)
from the level of the common femoral vein through the popliteal and
proximal calf veins.

[Series 1: us venous img lower uni left (dvt) · portal-venous · 14 of 43 slices shown]
[im 1/43]
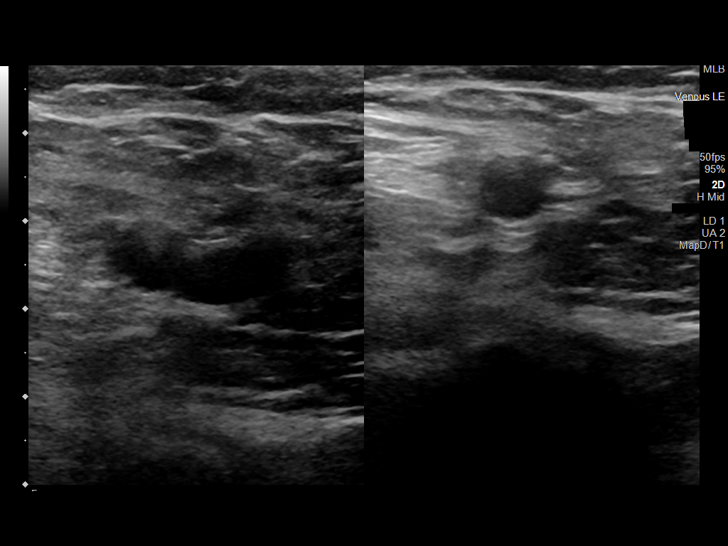
[im 4/43]
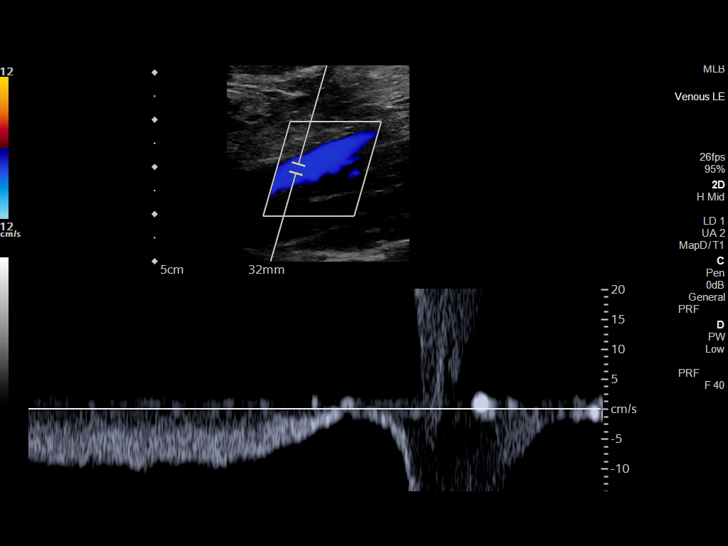
[im 8/43]
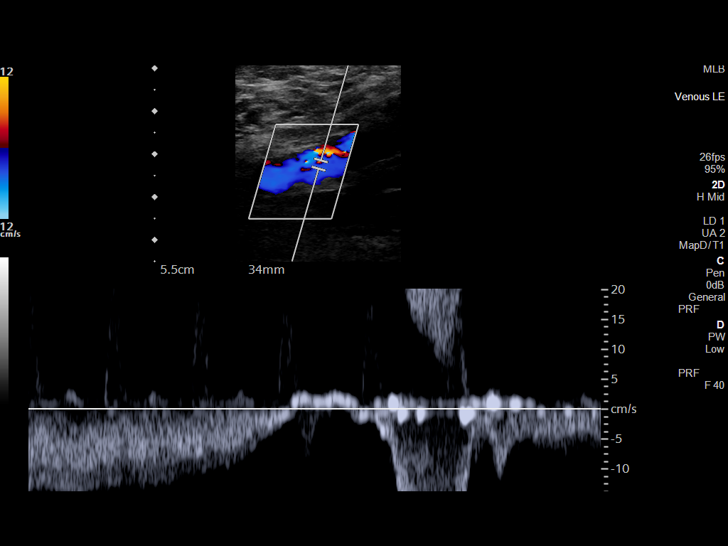
[im 11/43]
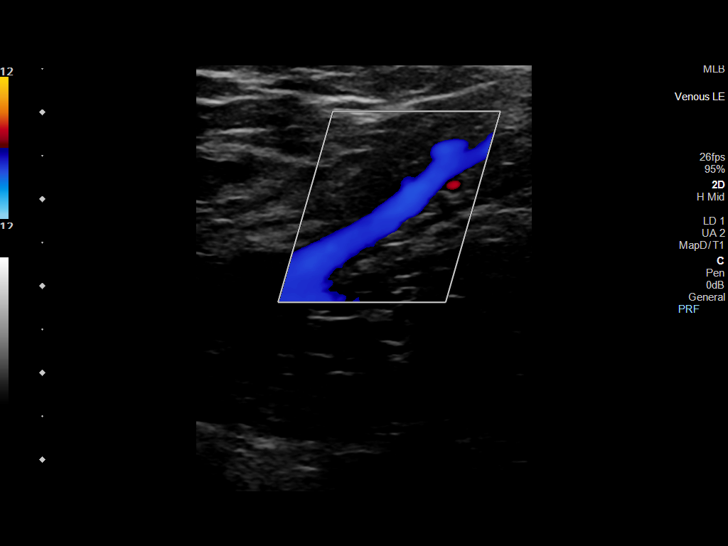
[im 13/43]
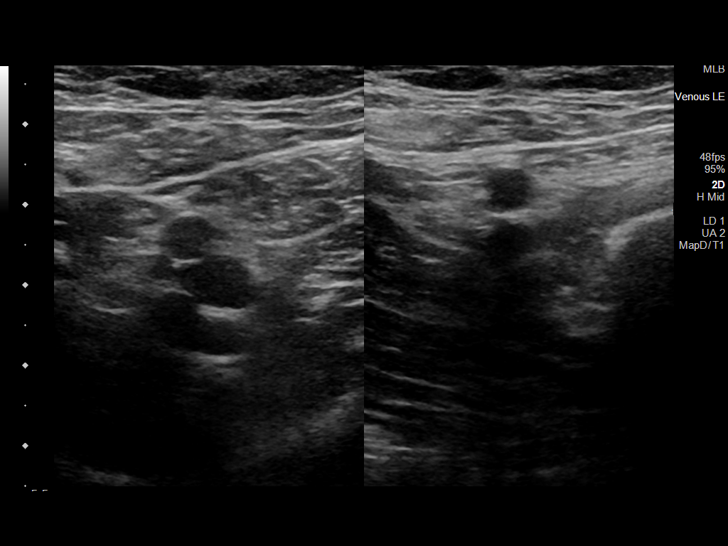
[im 17/43]
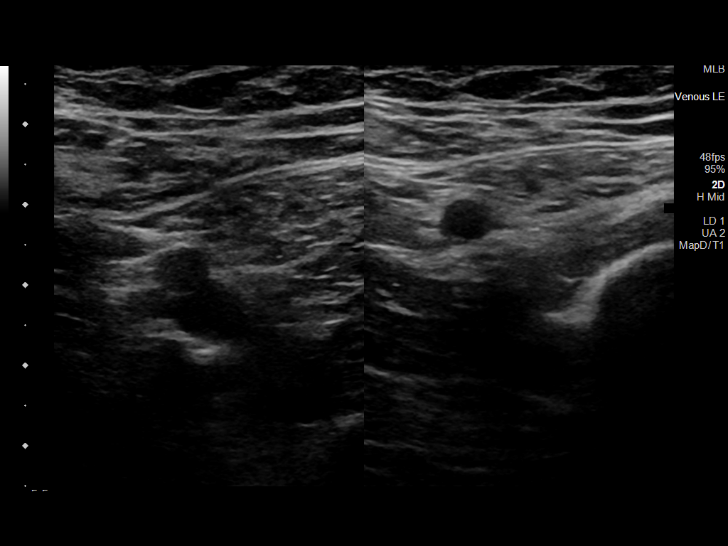
[im 21/43]
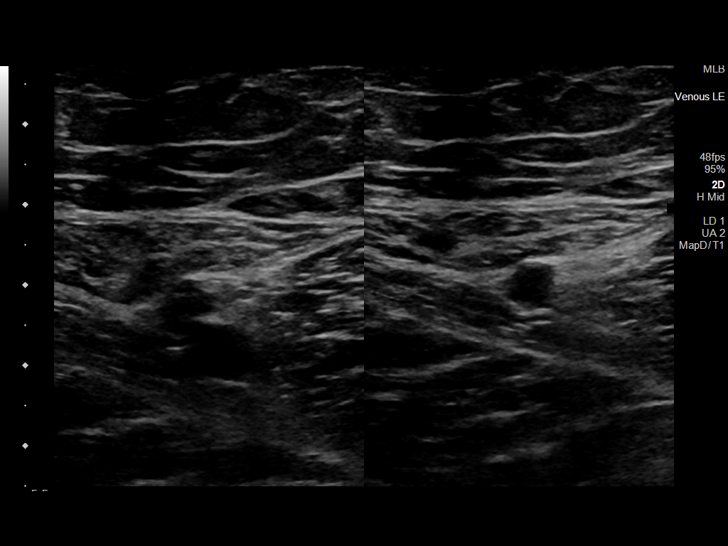
[im 22/43]
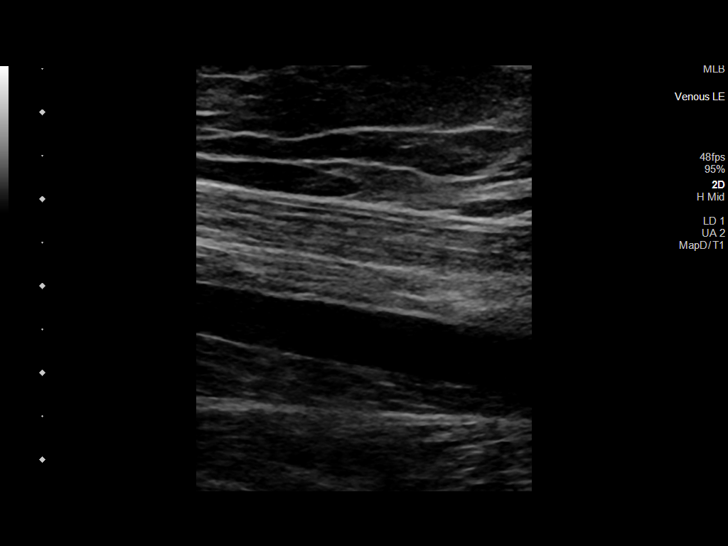
[im 26/43]
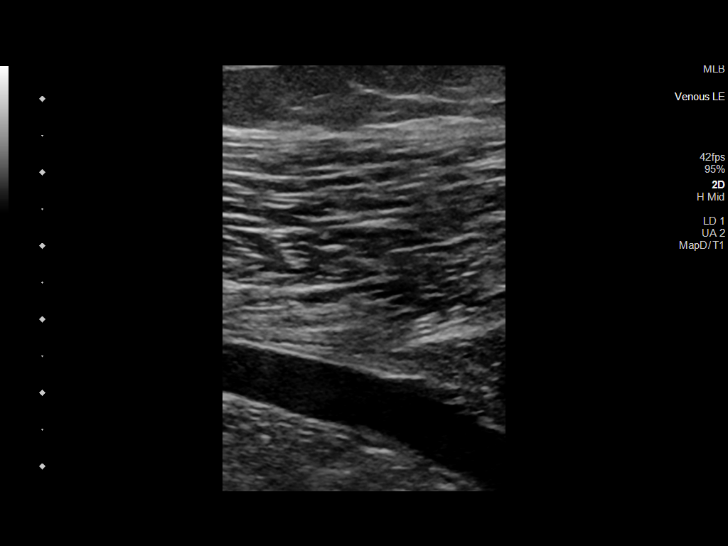
[im 30/43]
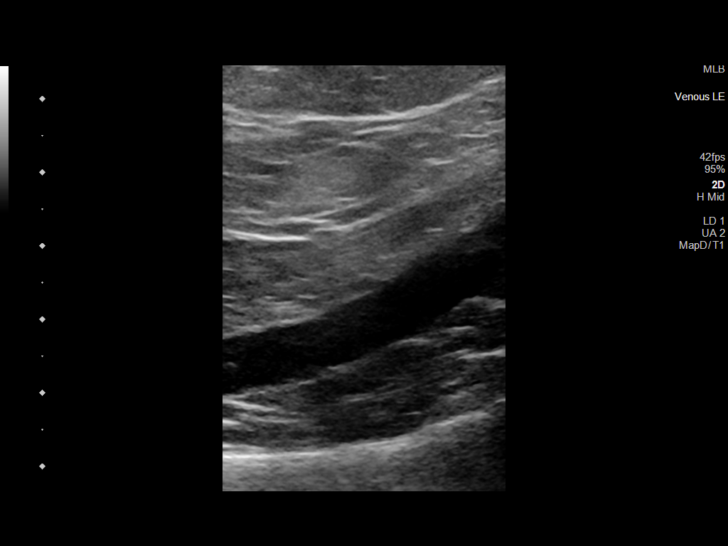
[im 33/43]
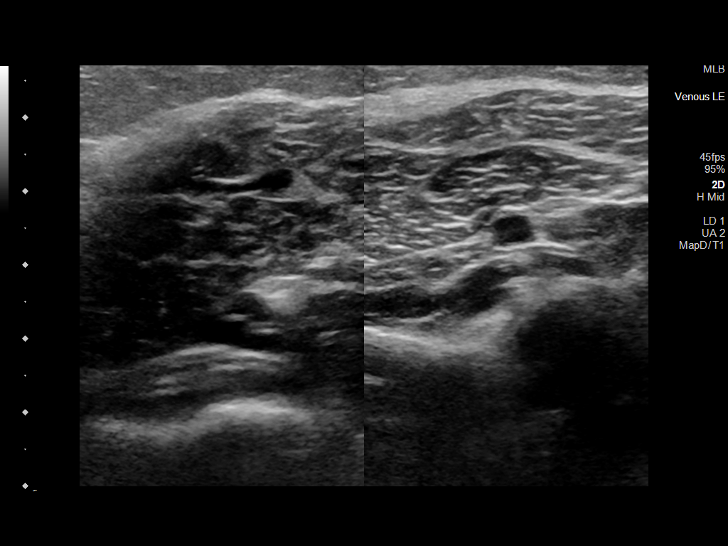
[im 35/43]
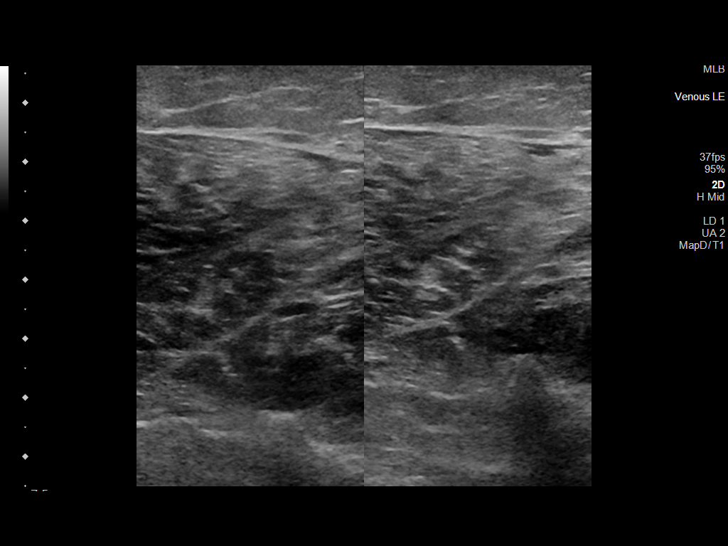
[im 39/43]
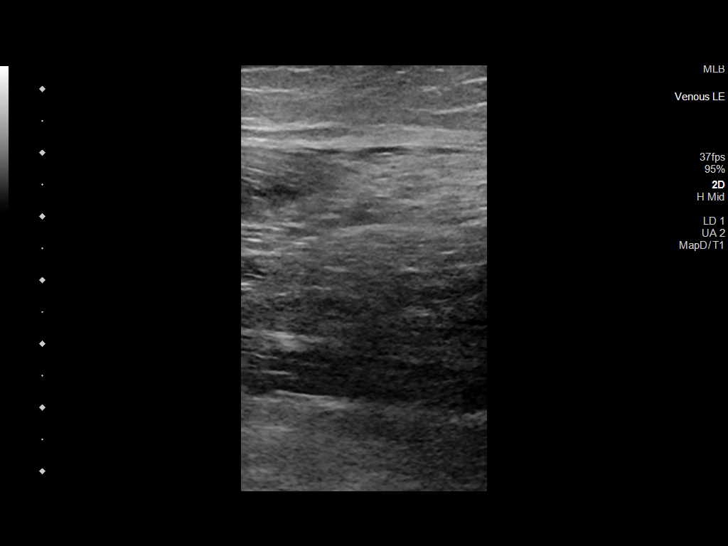
[im 43/43]
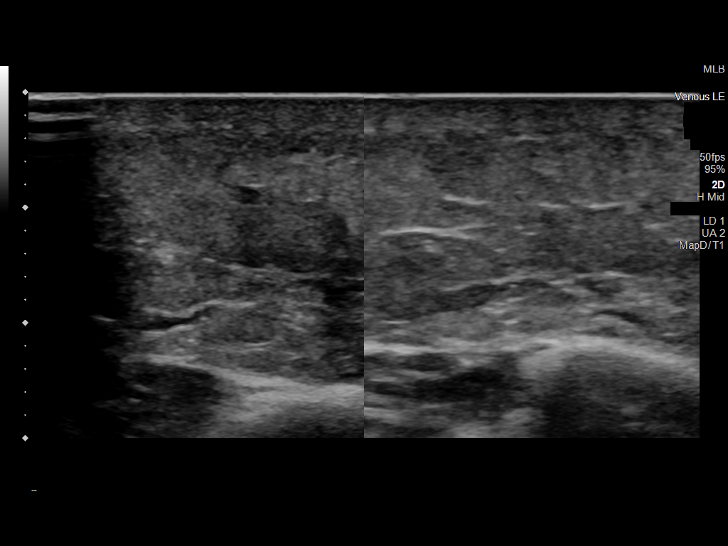

[14 of 24 positions shown; findings below may reference images not displayed]

FINDINGS: VENOUS

Normal compressibility of the common femoral, superficial femoral,
and popliteal veins, as well as the visualized calf veins.
Visualized portions of profunda femoral vein and great saphenous
vein unremarkable. No filling defects to suggest DVT on grayscale or
color Doppler imaging. Doppler waveforms show normal direction of
venous flow, normal respiratory plasticity and response to
augmentation.

Limited views of the contralateral common femoral vein are
unremarkable.

OTHER

No evidence of superficial thrombophlebitis or abnormal fluid
collection.

Limitations: none
IMPRESSION: No evidence of femoropopliteal DVT within the LEFT lower extremity.

## 2022-09-08 ENCOUNTER — Ambulatory Visit: Payer: Medicaid Other | Attending: Urology | Admitting: Physical Therapy

## 2022-09-08 DIAGNOSIS — M62838 Other muscle spasm: Secondary | ICD-10-CM | POA: Diagnosis not present

## 2022-09-08 DIAGNOSIS — R279 Unspecified lack of coordination: Secondary | ICD-10-CM | POA: Insufficient documentation

## 2022-09-08 DIAGNOSIS — R293 Abnormal posture: Secondary | ICD-10-CM | POA: Diagnosis not present

## 2022-09-08 DIAGNOSIS — M6281 Muscle weakness (generalized): Secondary | ICD-10-CM | POA: Diagnosis not present

## 2022-09-08 NOTE — Therapy (Addendum)
OUTPATIENT PHYSICAL THERAPY FEMALE PELVIC TREATMENT   Patient Name: Grace Sheppard MRN: [id number] DOB:08-03-1996, 26 y.o., female Today's Date: 09/08/2022   PT End of Session - 09/08/22 0800     Visit Number 5   5 more   Number of Visits 6   6 visits starting 09/08/22   Date for PT Re-Evaluation 11/08/22    Authorization Type [employer]    Authorization Time Period 09/08/22-11/06/22    Authorization - Visit Number 1    Authorization - Number of Visits 6    PT Start Time 0800    PT Stop Time 9735    PT Time Calculation (min) 47 min    Activity Tolerance Patient tolerated treatment well    Behavior During Therapy [facility] for tasks assessed/performed             Past Medical History:  Diagnosis Date   Anxiety    Conversion disorder    Neuropathy    Post partum depression    Pseudoseizures    Syncope    recurrent   Past Surgical History:  Procedure Laterality Date   CHOLECYSTECTOMY     TUBAL LIGATION     Patient Active Problem List   Diagnosis Date Noted   Left spastic hemiparesis ([place]) 06/13/2022   Cancer of the bladder, stage IV ([place]) 05/09/2019   Mild persistent asthma with acute exacerbation 04/11/2019   Anxiety and depression 01/01/2018   Conversion reaction 07/15/2016    PCP: Joya Gaskins, FNP  REFERRING PROVIDER: Bjorn Loser, MD  REFERRING DIAG: N39.46 (ICD-10-CM) - Mixed incontinence R35.0 (ICD-10-CM) - Frequency of micturition  THERAPY DIAG:  Muscle weakness (generalized) - Plan: PT plan of care cert/re-cert  Unspecified lack of coordination - Plan: PT plan of care cert/re-cert  Abnormal posture - Plan: PT plan of care cert/re-cert  Other muscle spasm - Plan: PT plan of care cert/re-cert  Rationale for Evaluation and Treatment Rehabilitation  ONSET DATE: 2-3 years for leakage; constipation for many years  SUBJECTIVE:                                                                                                                                                                                            SUBJECTIVE STATEMENT: Pt reports her pain has been worse in the last few days, did return to urology and they gave a new medication to try. Pt reports she has been doing prolapse relief positions and notices when she does them before bed she feels better and gets up less at night to urinate. Pt states voiding mechanics training has really helped voiding bowels and bladder, pain overall  between sessions doesn't get as high now. Relaxation techniques has helped lower pain levels for intercourse but still present.   Fluid intake: Yes: water, cranberry juice, ginger ale are only drinks   07/28/2022 "pretty much only water now"   PAIN:  Are you having pain? Yes NPRS scale: 4/10 Pain location:  globally  Intercourse has been more painful    PRECAUTIONS: None   PATIENT GOALS to have less pain or leakage  PERTINENT HISTORY:  FNS/Conversion Disorder, grade 2 cystocele, mild rectocele, mixed incontinence, mild bedwetting, recurrent UTIs, chronic pelvic pain, possible IC, fibromyalgia, RA, depression, anxiety, seizure disorder, acute gastric ulcer with hemorrhage   Sexual abuse: No  BOWEL MOVEMENT Pain with bowel movement: No Type of bowel movement:Type (Bristol Stool Scale) 1-2, Frequency once per week, and Strain Yes Fully empty rectum: No Leakage: No Pads: No Fiber supplement: Yes: miralax, Linzess, stool softener   URINATION Pain with urination: Yes Fully empty bladder: No Stream: Strong and Weak Urgency: Yes:   Frequency: varies could be every 15 minutes to two hours; nighttime sometimes more than 4x Leakage: Urge to void, Walking to the bathroom, Coughing, Sneezing, Laughing, and Exercise Pads: Yes: changes 4-5 per day  INTERCOURSE Pain with intercourse: Initial Penetration, During Penetration, and After Intercourse Ability to have vaginal penetration:  Yes: but painful Climax: sometimes painful but able to  achieve  Marinoff Scale: 1/3  PREGNANCY Vaginal deliveries 2 Tearing Yes: tearing with first C-section deliveries 0 Currently pregnant No  PROLAPSE Bulge/heaviness fairly constantly    OBJECTIVE:   DIAGNOSTIC FINDINGS:    PATIENT SURVEYS:   PFIQ-7 76 09/08/22 48  COGNITION:  Overall cognitive status: Within functional limits for tasks assessed     SENSATION:  Light touch: Appears intact  Proprioception: Appears intact  MUSCLE LENGTH: Bil hamstrings and adductors limited by 50%    FUNCTIONAL TESTS:  Difficulty with functional squat with demonstrated forward trunk, bil valgus but worse on Lt, and poor decent ability with slow ascent ability   GAIT: Distance walked: 250' Assistive device utilized: Lobbyist Level of assistance: Modified independence Comments: Lt AFO in place, Lt UE hand/wrist splint, noted decreased trunk rotation and arm swing, decreased step length and height at LT LE, decreased cadence               POSTURE: rounded shoulders, forward head, and posterior pelvic tilt    LUMBARAROM/PROM  A/PROM A/PROM  eval  Flexion Limited by 50%  Extension Limited by 25%  Right lateral flexion Limited by 25%  Left lateral flexion Limited by 25%  Right rotation Limited by 25%  Left rotation Limited by 25%   (Blank rows = not tested)  LOWER EXTREMITY ROM:  Bil WFL LOWER EXTREMITY MMT:  Lt hip 2+/5 in all directions, knee 3+/5; ankle not tested due to AFO in place Rt hip 3+/5 in all directions and knee 4/5, ankle 4+/5   PALPATION:   General  TTP throughout lower abdominal quadrants, worst over bladder and at anterior pelvis                External Perineal Exam TTP at Rt proximal adductor                             Internal Pelvic Floor TTP throughout superficial and deep muscle layers   Patient confirms identification and approves PT to assess internal pelvic floor and treatment Yes  PELVIC MMT:  MMT eval  Vaginal Initial  attempts demonstrated heavy pushing/bearing down. Without this pt unable to activate pelvic floor 0/5  Internal Anal Sphincter   External Anal Sphincter   Puborectalis   Diastasis Recti   (Blank rows = not tested)        TONE: Slightly increased   PROLAPSE: Grade 2 anterior wall laxity; grade 1 posterior wall laxity in hooklying (morning appt time) with strong cough  TODAY'S TREATMENT   09/08/22:  Manual: Pt consented to internal manual treatment today, found to have Rt side of pelvic floor at pubococcygeus, and Rt bulbocavernosus with only trigger points found along pubococcygeus. All released well and pt reported she felt less tight and didn't have as much pain at end of session. Lt side of pelvic floor had tension at deeper layer of pelvic floor but no trigger points and pt reported not having a lot of pain here just felt tight, this released well and overall bil pelvic floor superficial and deep layers much more mobile.  Pt reports she has gotten a pelvic wand hasn't tried it yet but wanted to review this during session, this was completed and pt reported understanding. Pt reports she will attempt wand use 1-2x during the week to see if this continues to help pain and muscle mobility between sessions.     PATIENT EDUCATION:  Education details: prolapse relief positions, HEP Person educated: Patient Education method: Explanation, Demonstration, Tactile cues, Verbal cues, and Handouts Education comprehension: verbalized understanding and returned demonstration   HOME EXERCISE PROGRAM: [id number]  ASSESSMENT:  CLINICAL IMPRESSION: Patient reports she has seen improvement with pain overall not getting as high between sessions (was a little higher this past week but pt was unable to come to last appt after having to take son to an appt and went longer between sessions). Pt also states recommendations with voiding mechanics and pelvic relaxation has helped with emptying bowels and  bladder and having slightly less pain with intercourse. Symptoms still there but not as bad. Prolapse relief positions have also improved nighttime urination frequency and prolapse symptoms after completing. Pt tolerated well. Pt would benefit from additional PT to further address deficits.       OBJECTIVE IMPAIRMENTS decreased coordination, decreased endurance, decreased mobility, difficulty walking, decreased strength, increased fascial restrictions, increased muscle spasms, impaired flexibility, improper body mechanics, postural dysfunction, and pain.   ACTIVITY LIMITATIONS carrying, lifting, standing, squatting, continence, and locomotion level  PARTICIPATION LIMITATIONS: cleaning, laundry, interpersonal relationship, community activity, occupation, and yard work  PERSONAL FACTORS Fitness, Time since onset of injury/illness/exacerbation, and 1 comorbidity: medical history   are also affecting patient's functional outcome.   REHAB POTENTIAL: Good  CLINICAL DECISION MAKING: Evolving/moderate complexity  EVALUATION COMPLEXITY: Moderate   GOALS: Goals reviewed with patient? Yes  SHORT TERM GOALS: Target date: 07/22/2022  Pt to be I with HEP.  Baseline: Goal status: MET  2.  Pt will have 25% less urgency due to bladder retraining and strengthening  Baseline:  Goal status: MET   3.  Pt to report improved time between bladder voids to at least 1 hours for improved QOL with decreased urinary frequency.   Baseline:  Goal status: ON GOING  4.  Pt to demonstrate at least 2/5 pelvic floor strength for improved pelvic stability and decreased strain at pelvic floor/ decrease leakage.  Baseline:  Goal status: ON GOING - focus of treatment has been on relaxing pelvic floor to allow full muscle mobility for improved strength training  5.  Pt will report her BMs and bladder voids are more complete at least 50% of the time due to improved bowel habits and evacuation techniques.  Baseline:   Goal status: MET   LONG TERM GOALS: Target date:  09/24/22    Pt to be I with advanced HEP.  Baseline:  Goal status: INITIAL  2.  Pt will have 50% less urgency due to bladder retraining and strengthening  Baseline:  Goal status: INITIAL  3.  Pt to report improved time between bladder voids to at least 2 hours for improved QOL with decreased urinary frequency.   Baseline:  Goal status: INITIAL  4.  Pt to demonstrate at least 3/5 pelvic floor strength for improved pelvic stability and decreased strain at pelvic floor/ decrease leakage.  Baseline:  Goal status: INITIAL  5.  Pt will report her BMs and bladder voids are more complete at least 75% of the time due to improved bowel habits and evacuation techniques.  Baseline:  Goal status: INITIAL  6.  Pt to demonstrate improved coordination of breathing mechanics and pelvic floor mobility for squatting 10# without leakage for improved pelvic stability.  Baseline:  Goal status: INITIAL  PLAN: PT FREQUENCY: every other week  PT DURATION:  8 sessions  PLANNED INTERVENTIONS: Therapeutic exercises, Therapeutic activity, Neuromuscular re-education, Patient/Family education, Self Care, Joint mobilization, Aquatic Therapy, Dry Needling, Spinal mobilization, Cryotherapy, Moist heat, scar mobilization, Taping, Biofeedback, and Manual therapy  PLAN FOR NEXT SESSION: manual abdomen, hip and spine stretching, breathing and voiding mechanics, core strengthening,   Stacy Gardner, PT, DPT 11/09/239:47 AM

## 2022-09-08 NOTE — Addendum Note (Signed)
Addended by: Junie Panning on: 09/08/2022 09:43 AM   Modules accepted: Orders

## 2022-09-15 DIAGNOSIS — M546 Pain in thoracic spine: Secondary | ICD-10-CM | POA: Diagnosis not present

## 2022-09-19 ENCOUNTER — Encounter: Payer: Medicaid Other | Admitting: Physical Therapy

## 2022-09-19 DIAGNOSIS — F411 Generalized anxiety disorder: Secondary | ICD-10-CM | POA: Diagnosis not present

## 2022-09-21 DIAGNOSIS — G43909 Migraine, unspecified, not intractable, without status migrainosus: Secondary | ICD-10-CM | POA: Diagnosis not present

## 2022-09-21 DIAGNOSIS — R42 Dizziness and giddiness: Secondary | ICD-10-CM | POA: Diagnosis not present

## 2022-09-21 DIAGNOSIS — E538 Deficiency of other specified B group vitamins: Secondary | ICD-10-CM | POA: Diagnosis not present

## 2022-09-21 DIAGNOSIS — I10 Essential (primary) hypertension: Secondary | ICD-10-CM | POA: Diagnosis not present

## 2022-09-28 ENCOUNTER — Ambulatory Visit: Payer: Medicaid Other | Admitting: Physical Therapy

## 2022-09-28 DIAGNOSIS — M62838 Other muscle spasm: Secondary | ICD-10-CM | POA: Diagnosis not present

## 2022-09-28 DIAGNOSIS — M6281 Muscle weakness (generalized): Secondary | ICD-10-CM

## 2022-09-28 DIAGNOSIS — R279 Unspecified lack of coordination: Secondary | ICD-10-CM

## 2022-09-28 DIAGNOSIS — R293 Abnormal posture: Secondary | ICD-10-CM

## 2022-09-28 NOTE — Therapy (Signed)
OUTPATIENT PHYSICAL THERAPY FEMALE PELVIC TREATMENT   Patient Name: Grace Sheppard MRN: [id number] DOB:08-Sep-1996, 26 y.o., female Today's Date: 09/28/2022   PT End of Session - 09/28/22 0933     Visit Number 6   4 more   Number of Visits 6   6 visits starting 09/08/22   Date for PT Re-Evaluation 11/08/22    Authorization Type [employer]    Authorization Time Period 09/08/22-11/06/22    Authorization - Number of Visits 6    PT Start Time 0930    PT Stop Time 1010    PT Time Calculation (min) 40 min    Activity Tolerance Patient tolerated treatment well    Behavior During Therapy [facility] for tasks assessed/performed             Past Medical History:  Diagnosis Date   Anxiety    Conversion disorder    Neuropathy    Post partum depression    Pseudoseizures    Syncope    recurrent   Past Surgical History:  Procedure Laterality Date   CHOLECYSTECTOMY     TUBAL LIGATION     Patient Active Problem List   Diagnosis Date Noted   Left spastic hemiparesis ([place]) 06/13/2022   Cancer of the bladder, stage IV ([place]) 05/09/2019   Mild persistent asthma with acute exacerbation 04/11/2019   Anxiety and depression 01/01/2018   Conversion reaction 07/15/2016    PCP: Joya Gaskins, FNP  REFERRING PROVIDER: Bjorn Loser, MD  REFERRING DIAG: N39.46 (ICD-10-CM) - Mixed incontinence R35.0 (ICD-10-CM) - Frequency of micturition  THERAPY DIAG:  Muscle weakness (generalized)  Unspecified lack of coordination  Abnormal posture  Rationale for Evaluation and Treatment Rehabilitation  ONSET DATE: 2-3 years for leakage; constipation for many years  SUBJECTIVE:                                                                                                                                                                                           SUBJECTIVE STATEMENT: Pt reports she feels 40%-50% of improvement since starting PFPT.Marland Kitchen pt reports she has gotten a wand and this is  helping keep overall pain levels not rising to higher levels between session. Pt states with wand use she does still have pain but not as high (now highest 6/10 prior to wand use 8/10, best pain after wand use 3/10).   Fluid intake: Yes: water, cranberry juice, ginger ale are only drinks   07/28/2022 "pretty much only water now"   PAIN:  Are you having pain? Yes NPRS scale: 5/10 Pain location:  pelvic area  Intercourse has been more painful  PRECAUTIONS: None   PATIENT GOALS to have less pain or leakage  PERTINENT HISTORY:  FNS/Conversion Disorder, grade 2 cystocele, mild rectocele, mixed incontinence, mild bedwetting, recurrent UTIs, chronic pelvic pain, possible IC, fibromyalgia, RA, depression, anxiety, seizure disorder, acute gastric ulcer with hemorrhage   Sexual abuse: No  BOWEL MOVEMENT Pain with bowel movement: No Type of bowel movement:Type (Bristol Stool Scale) 1-2, Frequency once per week, and Strain Yes Fully empty rectum: No Leakage: No Pads: No Fiber supplement: Yes: miralax, Linzess, stool softener   URINATION Pain with urination: Yes Fully empty bladder: No Stream: Strong and Weak Urgency: Yes:   Frequency: varies could be every 15 minutes to two hours; nighttime sometimes more than 4x Leakage: Urge to void, Walking to the bathroom, Coughing, Sneezing, Laughing, and Exercise Pads: Yes: changes 4-5 per day  INTERCOURSE Pain with intercourse: Initial Penetration, During Penetration, and After Intercourse Ability to have vaginal penetration:  Yes: but painful Climax: sometimes painful but able to achieve  Marinoff Scale: 1/3  PREGNANCY Vaginal deliveries 2 Tearing Yes: tearing with first C-section deliveries 0 Currently pregnant No  PROLAPSE Bulge/heaviness fairly constantly    OBJECTIVE:   DIAGNOSTIC FINDINGS:    PATIENT SURVEYS:   PFIQ-7 76 09/08/22 48  COGNITION:  Overall cognitive status: Within functional limits for tasks  assessed     SENSATION:  Light touch: Appears intact  Proprioception: Appears intact  MUSCLE LENGTH: Bil hamstrings and adductors limited by 50%    FUNCTIONAL TESTS:  Difficulty with functional squat with demonstrated forward trunk, bil valgus but worse on Lt, and poor decent ability with slow ascent ability   GAIT: Distance walked: 250' Assistive device utilized: Lobbyist Level of assistance: Modified independence Comments: Lt AFO in place, Lt UE hand/wrist splint, noted decreased trunk rotation and arm swing, decreased step length and height at LT LE, decreased cadence               POSTURE: rounded shoulders, forward head, and posterior pelvic tilt    LUMBARAROM/PROM  A/PROM A/PROM  eval  Flexion Limited by 50%  Extension Limited by 25%  Right lateral flexion Limited by 25%  Left lateral flexion Limited by 25%  Right rotation Limited by 25%  Left rotation Limited by 25%   (Blank rows = not tested)  LOWER EXTREMITY ROM:  Bil WFL LOWER EXTREMITY MMT:  Lt hip 2+/5 in all directions, knee 3+/5; ankle not tested due to AFO in place Rt hip 3+/5 in all directions and knee 4/5, ankle 4+/5   PALPATION:   General  TTP throughout lower abdominal quadrants, worst over bladder and at anterior pelvis                External Perineal Exam TTP at Rt proximal adductor                             Internal Pelvic Floor TTP throughout superficial and deep muscle layers   Patient confirms identification and approves PT to assess internal pelvic floor and treatment Yes  PELVIC MMT:   MMT eval  Vaginal Initial attempts demonstrated heavy pushing/bearing down. Without this pt unable to activate pelvic floor 0/5  Internal Anal Sphincter   External Anal Sphincter   Puborectalis   Diastasis Recti   (Blank rows = not tested)        TONE: Slightly increased   PROLAPSE: Grade 2 anterior wall laxity; grade 1 posterior wall  laxity in hooklying (morning appt time)  with strong cough  TODAY'S TREATMENT   09/28/22 :  Manual: Abdominal massage in hooklying with PT providing x10 abdominal massage, x10 ILU, x10 "m" method Pt also educated on how to complete abdominal massage at home to improve bowel regularity   Self care: pt educated on moisturizers for vulva area as pt asked about something for dryness without hormones or medications. Pt also instructed to ask MD about any recommendations and to confirm any new moisturizers and to always stop use if any adverse reactions. Pt agreed.   PATIENT EDUCATION:  Education details: prolapse relief positions, HEP Person educated: Patient Education method: Explanation, Demonstration, Tactile cues, Verbal cues, and Handouts Education comprehension: verbalized understanding and returned demonstration   HOME EXERCISE PROGRAM: [id number]  ASSESSMENT:  CLINICAL IMPRESSION: Patient reports she is 40-50% better since starting PT, is hard to put a number on progress as "I know I have a lot of issues we are working on but I have seen improvement in pain, straining for bowels and urine and ways to prevent worsening my prolapse." Pt continues to demonstrate need for PT for further improvement with urgency, urinary leakage, pelvic pain, constipation, and prolapse. Pt has shown improvement with slowly due to complexity of symptoms. Pt would benefit from additional PT to further address deficits.       OBJECTIVE IMPAIRMENTS decreased coordination, decreased endurance, decreased mobility, difficulty walking, decreased strength, increased fascial restrictions, increased muscle spasms, impaired flexibility, improper body mechanics, postural dysfunction, and pain.   ACTIVITY LIMITATIONS carrying, lifting, standing, squatting, continence, and locomotion level  PARTICIPATION LIMITATIONS: cleaning, laundry, interpersonal relationship, community activity, occupation, and yard work  PERSONAL FACTORS Fitness, Time since onset of  injury/illness/exacerbation, and 1 comorbidity: medical history   are also affecting patient's functional outcome.   REHAB POTENTIAL: Good  CLINICAL DECISION MAKING: Evolving/moderate complexity  EVALUATION COMPLEXITY: Moderate   GOALS: Goals reviewed with patient? Yes  SHORT TERM GOALS: Target date: 07/22/2022  Pt to be I with HEP.  Baseline: Goal status: MET  2.  Pt will have 25% less urgency due to bladder retraining and strengthening  Baseline:  Goal status: MET   3.  Pt to report improved time between bladder voids to at least 1 hours for improved QOL with decreased urinary frequency.   Baseline:  Goal status: ON GOING  4.  Pt to demonstrate at least 2/5 pelvic floor strength for improved pelvic stability and decreased strain at pelvic floor/ decrease leakage.  Baseline:  Goal status: ON GOING - focus of treatment has been on relaxing pelvic floor to allow full muscle mobility for improved strength training  5.  Pt will report her BMs and bladder voids are more complete at least 50% of the time due to improved bowel habits and evacuation techniques.  Baseline:  Goal status: MET   LONG TERM GOALS: Target date:  09/24/22  >11/08/22  Pt to be I with advanced HEP.  Baseline:  Goal status: on going  2.  Pt will have 50% less urgency due to bladder retraining and strengthening  Baseline:  Goal status: on going  3.  Pt to report improved time between bladder voids to at least 2 hours for improved QOL with decreased urinary frequency.   Baseline:  Goal status: on going  4.  Pt to demonstrate at least 3/5 pelvic floor strength for improved pelvic stability and decreased strain at pelvic floor/ decrease leakage.  Baseline:  Goal status: on going - focus  of sessions have been to improve pain levels and muscle relaxation first then progress with strengthening  5.  Pt will report her BMs and bladder voids are more complete at least 75% of the time due to improved bowel  habits and evacuation techniques.  Baseline:  Goal status: on going  6.  Pt to demonstrate improved coordination of breathing mechanics and pelvic floor mobility for squatting 10# without leakage for improved pelvic stability.  Baseline:  Goal status: on going  PLAN: PT FREQUENCY: every other week  PT DURATION:  8 sessions  PLANNED INTERVENTIONS: Therapeutic exercises, Therapeutic activity, Neuromuscular re-education, Patient/Family education, Self Care, Joint mobilization, Aquatic Therapy, Dry Needling, Spinal mobilization, Cryotherapy, Moist heat, scar mobilization, Taping, Biofeedback, and Manual therapy  PLAN FOR NEXT SESSION: manual abdomen, hip and spine stretching, breathing and voiding mechanics, core strengthening,   Stacy Gardner, PT, DPT 09/28/2309:13 AM

## 2022-10-13 ENCOUNTER — Ambulatory Visit: Payer: Medicaid Other | Attending: Urology | Admitting: Physical Therapy

## 2022-10-13 DIAGNOSIS — R279 Unspecified lack of coordination: Secondary | ICD-10-CM | POA: Diagnosis present

## 2022-10-13 DIAGNOSIS — M62838 Other muscle spasm: Secondary | ICD-10-CM | POA: Diagnosis present

## 2022-10-13 DIAGNOSIS — M6281 Muscle weakness (generalized): Secondary | ICD-10-CM | POA: Insufficient documentation

## 2022-10-13 DIAGNOSIS — R293 Abnormal posture: Secondary | ICD-10-CM | POA: Insufficient documentation

## 2022-10-13 NOTE — Therapy (Signed)
OUTPATIENT PHYSICAL THERAPY FEMALE PELVIC TREATMENT   Patient Name: Grace Sheppard MRN: [id number] DOB:29-Jan-1996, 26 y.o., female Today's Date: 10/13/2022   PT End of Session - 10/13/22 0755     Visit Number 7   6 visits from 11/9 (3 more)   Number of Visits --    Date for PT Re-Evaluation 11/08/22    Authorization Type [employer]    Authorization Time Period 09/08/22-11/06/22    Authorization - Visit Number 3    Authorization - Number of Visits 6    PT Start Time 0800    PT Stop Time 0842    PT Time Calculation (min) 42 min    Activity Tolerance Patient tolerated treatment well    Behavior During Therapy [facility] for tasks assessed/performed             Past Medical History:  Diagnosis Date   Anxiety    Conversion disorder    Neuropathy    Post partum depression    Pseudoseizures    Syncope    recurrent   Past Surgical History:  Procedure Laterality Date   CHOLECYSTECTOMY     TUBAL LIGATION     Patient Active Problem List   Diagnosis Date Noted   Left spastic hemiparesis ([place]) 06/13/2022   Cancer of the bladder, stage IV ([place]) 05/09/2019   Mild persistent asthma with acute exacerbation 04/11/2019   Anxiety and depression 01/01/2018   Conversion reaction 07/15/2016    PCP: Joya Gaskins, FNP  REFERRING PROVIDER: Bjorn Loser, MD  REFERRING DIAG: N39.46 (ICD-10-CM) - Mixed incontinence R35.0 (ICD-10-CM) - Frequency of micturition  THERAPY DIAG:  Muscle weakness (generalized)  Unspecified lack of coordination  Abnormal posture  Other muscle spasm  Rationale for Evaluation and Treatment Rehabilitation  ONSET DATE: 2-3 years for leakage; constipation for many years  SUBJECTIVE:                                                                                                                                                                                           SUBJECTIVE STATEMENT: Pt reports she has been having improved pelvic pain with  wand use overall and has started using a lubricant her and partner bought and best pelvic pain is 3/10 now. Uses wand prior to intercourse and this improves pain to 4/10 instead of 8-9/10 pain with penetration and during. Still sore after for a couple hours. Has been having spasm like pain at abdomen last week and plans to speak to doctor tomorrow. Pt now having bowel movements every other day and doing abdominal massage 2x per day (am/pm)  Fluid intake: Yes: water, cranberry  juice, ginger ale are only drinks   07/28/2022 "pretty much only water now"   PAIN:  Are you having pain? Yes NPRS scale: 5/10 Pain location:  global with weather    PRECAUTIONS: None   PATIENT GOALS to have less pain or leakage  PERTINENT HISTORY:  FNS/Conversion Disorder, grade 2 cystocele, mild rectocele, mixed incontinence, mild bedwetting, recurrent UTIs, chronic pelvic pain, possible IC, fibromyalgia, RA, depression, anxiety, seizure disorder, acute gastric ulcer with hemorrhage   Sexual abuse: No  BOWEL MOVEMENT Pain with bowel movement: No Type of bowel movement:Type (Bristol Stool Scale) 1-2, Frequency once per week, and Strain Yes Fully empty rectum: No Leakage: No Pads: No Fiber supplement: Yes: miralax, Linzess, stool softener   URINATION Pain with urination: Yes Fully empty bladder: No Stream: Strong and Weak Urgency: Yes:   Frequency: varies could be every 15 minutes to two hours; nighttime sometimes more than 4x Leakage: Urge to void, Walking to the bathroom, Coughing, Sneezing, Laughing, and Exercise Pads: Yes: changes 4-5 per day  INTERCOURSE Pain with intercourse: Initial Penetration, During Penetration, and After Intercourse Ability to have vaginal penetration:  Yes: but painful Climax: sometimes painful but able to achieve  Marinoff Scale: 1/3  PREGNANCY Vaginal deliveries 2 Tearing Yes: tearing with first C-section deliveries 0 Currently pregnant  No  PROLAPSE Bulge/heaviness fairly constantly    OBJECTIVE:   DIAGNOSTIC FINDINGS:    PATIENT SURVEYS:   PFIQ-7 76 09/08/22 48  COGNITION:  Overall cognitive status: Within functional limits for tasks assessed     SENSATION:  Light touch: Appears intact  Proprioception: Appears intact  MUSCLE LENGTH: Bil hamstrings and adductors limited by 50%   FUNCTIONAL TESTS:  Difficulty with functional squat with demonstrated forward trunk, bil valgus but worse on Lt, and poor decent ability with slow ascent ability   GAIT: Distance walked: 250' Assistive device utilized: Lobbyist Level of assistance: Modified independence Comments: Lt AFO in place, Lt UE hand/wrist splint, noted decreased trunk rotation and arm swing, decreased step length and height at LT LE, decreased cadence               POSTURE: rounded shoulders, forward head, and posterior pelvic tilt    LUMBARAROM/PROM  A/PROM A/PROM  eval  Flexion Limited by 50%  Extension Limited by 25%  Right lateral flexion Limited by 25%  Left lateral flexion Limited by 25%  Right rotation Limited by 25%  Left rotation Limited by 25%   (Blank rows = not tested)  LOWER EXTREMITY ROM:  Bil WFL LOWER EXTREMITY MMT:  Lt hip 2+/5 in all directions, knee 3+/5; ankle not tested due to AFO in place Rt hip 3+/5 in all directions and knee 4/5, ankle 4+/5   PALPATION:   General  TTP throughout lower abdominal quadrants, worst over bladder and at anterior pelvis                External Perineal Exam TTP at Rt proximal adductor                             Internal Pelvic Floor TTP throughout superficial and deep muscle layers   Patient confirms identification and approves PT to assess internal pelvic floor and treatment Yes  PELVIC MMT:   MMT eval  Vaginal Initial attempts demonstrated heavy pushing/bearing down. Without this pt unable to activate pelvic floor 0/5  Internal Anal Sphincter   External Anal  Sphincter  Puborectalis   Diastasis Recti   (Blank rows = not tested)        TONE: Slightly increased   PROLAPSE: Grade 2 anterior wall laxity; grade 1 posterior wall laxity in hooklying (morning appt time) with strong cough  TODAY'S TREATMENT   09/28/22 :  Manual: Gentle stretching over bladder area in all directions, indirect fascial release and progressed to direct techniques for improved mobility. Throughout lower and mid abdominal quadrants. Pt tolerated well and reported feeling much looser and had no spasms with this. Also 3x30s hip flexor stretching to assist in mobility at lower abdomen and hip as well Diaphragmatic breathing cued throughout manual for improved relaxation.      PATIENT EDUCATION:  Education details: prolapse relief positions, HEP Person educated: Patient Education method: Explanation, Demonstration, Tactile cues, Verbal cues, and Handouts Education comprehension: verbalized understanding and returned demonstration   HOME EXERCISE PROGRAM: [id number]  ASSESSMENT:  CLINICAL IMPRESSION: Patient reports she has been seeing a big difference in bowels now with more consistency of abdominal massage, now emptying fully every other day. Pt also reports wand use prior to intercourse has been very helpful for lower pain levels at pelvic area in general and not having pain for as long post intercourse either. Pt does still have urinary urgency/frequency/leakage however less often (reports ~40% better). Pt has started having a bladder spasm in the past week and reports she plans to ask MD about this tomorrow as she had been told previously she may have IC from MD and wanted to ask more about this. Pt would benefit from additional PT to further address deficits.       OBJECTIVE IMPAIRMENTS decreased coordination, decreased endurance, decreased mobility, difficulty walking, decreased strength, increased fascial restrictions, increased muscle spasms, impaired  flexibility, improper body mechanics, postural dysfunction, and pain.   ACTIVITY LIMITATIONS carrying, lifting, standing, squatting, continence, and locomotion level  PARTICIPATION LIMITATIONS: cleaning, laundry, interpersonal relationship, community activity, occupation, and yard work  PERSONAL FACTORS Fitness, Time since onset of injury/illness/exacerbation, and 1 comorbidity: medical history   are also affecting patient's functional outcome.   REHAB POTENTIAL: Good  CLINICAL DECISION MAKING: Evolving/moderate complexity  EVALUATION COMPLEXITY: Moderate   GOALS: Goals reviewed with patient? Yes  SHORT TERM GOALS: Target date: 07/22/2022  Pt to be I with HEP.  Baseline: Goal status: MET  2.  Pt will have 25% less urgency due to bladder retraining and strengthening  Baseline:  Goal status: MET   3.  Pt to report improved time between bladder voids to at least 1 hours for improved QOL with decreased urinary frequency.   Baseline:  Goal status: ON GOING  4.  Pt to demonstrate at least 2/5 pelvic floor strength for improved pelvic stability and decreased strain at pelvic floor/ decrease leakage.  Baseline:  Goal status: ON GOING - focus of treatment has been on relaxing pelvic floor to allow full muscle mobility for improved strength training  5.  Pt will report her BMs and bladder voids are more complete at least 50% of the time due to improved bowel habits and evacuation techniques.  Baseline:  Goal status: MET   LONG TERM GOALS: Target date:  09/24/22  >11/08/22  Pt to be I with advanced HEP.  Baseline:  Goal status: MET  2.  Pt will have 50% less urgency due to bladder retraining and strengthening  Baseline:  Goal status: on going  3.  Pt to report improved time between bladder voids to at least 2 hours  for improved QOL with decreased urinary frequency.   Baseline:  Goal status: on going  4.  Pt to demonstrate at least 3/5 pelvic floor strength for improved pelvic  stability and decreased strain at pelvic floor/ decrease leakage.  Baseline:  Goal status: on going - focus of sessions have been to improve pain levels and muscle relaxation first then progress with strengthening  5.  Pt will report her BMs and bladder voids are more complete at least 75% of the time due to improved bowel habits and evacuation techniques.  Baseline:  Goal status: MET  6.  Pt to demonstrate improved coordination of breathing mechanics and pelvic floor mobility for squatting 10# without leakage for improved pelvic stability.  Baseline:  Goal status: on going  PLAN: PT FREQUENCY: every other week  PT DURATION:  8 sessions  PLANNED INTERVENTIONS: Therapeutic exercises, Therapeutic activity, Neuromuscular re-education, Patient/Family education, Self Care, Joint mobilization, Aquatic Therapy, Dry Needling, Spinal mobilization, Cryotherapy, Moist heat, scar mobilization, Taping, Biofeedback, and Manual therapy  PLAN FOR NEXT SESSION: manual abdomen, hip and spine stretching, breathing and voiding mechanics, core strengthening,   Stacy Gardner, PT, DPT 12/14/238:48 AM

## 2022-10-17 DIAGNOSIS — E538 Deficiency of other specified B group vitamins: Secondary | ICD-10-CM | POA: Diagnosis not present

## 2022-10-19 DIAGNOSIS — J029 Acute pharyngitis, unspecified: Secondary | ICD-10-CM | POA: Diagnosis not present

## 2022-10-19 DIAGNOSIS — M21372 Foot drop, left foot: Secondary | ICD-10-CM | POA: Diagnosis not present

## 2022-10-26 ENCOUNTER — Ambulatory Visit: Payer: Medicaid Other | Admitting: Physical Therapy

## 2022-10-26 DIAGNOSIS — M6281 Muscle weakness (generalized): Secondary | ICD-10-CM | POA: Diagnosis not present

## 2022-10-26 DIAGNOSIS — R279 Unspecified lack of coordination: Secondary | ICD-10-CM

## 2022-10-26 DIAGNOSIS — R293 Abnormal posture: Secondary | ICD-10-CM

## 2022-10-26 DIAGNOSIS — M62838 Other muscle spasm: Secondary | ICD-10-CM

## 2022-10-26 NOTE — Therapy (Signed)
OUTPATIENT PHYSICAL THERAPY FEMALE PELVIC TREATMENT   Patient Name: Grace Sheppard MRN: [id number] DOB:19-Nov-1995, 26 y.o., female Today's Date: 10/26/2022   PT End of Session - 10/26/22 1403     Visit Number 8   6 visits from 11/9 (2 more)   Number of Visits 6   6 visits starting 09/08/22   Date for PT Re-Evaluation 11/08/22    Authorization Type [employer]    Authorization Time Period 09/08/22-11/06/22    Authorization - Visit Number 4    Authorization - Number of Visits 6    PT Start Time 1400    PT Stop Time 1440    PT Time Calculation (min) 40 min    Activity Tolerance Patient tolerated treatment well    Behavior During Therapy [facility] for tasks assessed/performed             Past Medical History:  Diagnosis Date   Anxiety    Conversion disorder    Neuropathy    Post partum depression    Pseudoseizures    Syncope    recurrent   Past Surgical History:  Procedure Laterality Date   CHOLECYSTECTOMY     TUBAL LIGATION     Patient Active Problem List   Diagnosis Date Noted   Left spastic hemiparesis ([place]) 06/13/2022   Cancer of the bladder, stage IV ([place]) 05/09/2019   Mild persistent asthma with acute exacerbation 04/11/2019   Anxiety and depression 01/01/2018   Conversion reaction 07/15/2016    PCP: Joya Gaskins, FNP  REFERRING PROVIDER: Bjorn Loser, MD  REFERRING DIAG: N39.46 (ICD-10-CM) - Mixed incontinence R35.0 (ICD-10-CM) - Frequency of micturition  THERAPY DIAG:  Muscle weakness (generalized)  Unspecified lack of coordination  Other muscle spasm  Abnormal posture  Rationale for Evaluation and Treatment Rehabilitation  ONSET DATE: 2-3 years for leakage; constipation for many years  SUBJECTIVE:                                                                                                                                                                                           SUBJECTIVE STATEMENT: Pt reports pelvic pain has been  better, was sick last week and had increased pelvic pain and wand did not help. Felt more like UTI but no other symptoms. Did have PT and OT last week before this and unsure if this made pain worse or not. Still having leakage but reports overall this is better than start of PT, did note increased leakage on Sunday but was more sedentary than usual and unsure if this helps.   Fluid intake: Yes: water, cranberry juice, ginger ale are only drinks  07/28/2022 "pretty much only water now"   PAIN:  Are you having pain? Yes NPRS scale: 5/10 Pain location:  global with weather    PRECAUTIONS: None   PATIENT GOALS to have less pain or leakage  PERTINENT HISTORY:  FNS/Conversion Disorder, grade 2 cystocele, mild rectocele, mixed incontinence, mild bedwetting, recurrent UTIs, chronic pelvic pain, possible IC, fibromyalgia, RA, depression, anxiety, seizure disorder, acute gastric ulcer with hemorrhage   Sexual abuse: No  BOWEL MOVEMENT Pain with bowel movement: No Type of bowel movement:Type (Bristol Stool Scale) 1-2, Frequency once per week, and Strain Yes Fully empty rectum: No Leakage: No Pads: No Fiber supplement: Yes: miralax, Linzess, stool softener   URINATION Pain with urination: Yes Fully empty bladder: No Stream: Strong and Weak Urgency: Yes:   Frequency: varies could be every 15 minutes to two hours; nighttime sometimes more than 4x Leakage: Urge to void, Walking to the bathroom, Coughing, Sneezing, Laughing, and Exercise Pads: Yes: changes 4-5 per day  INTERCOURSE Pain with intercourse: Initial Penetration, During Penetration, and After Intercourse Ability to have vaginal penetration:  Yes: but painful Climax: sometimes painful but able to achieve  Marinoff Scale: 1/3  PREGNANCY Vaginal deliveries 2 Tearing Yes: tearing with first C-section deliveries 0 Currently pregnant No  PROLAPSE Bulge/heaviness fairly constantly    OBJECTIVE:   DIAGNOSTIC FINDINGS:     PATIENT SURVEYS:   PFIQ-7 76 09/08/22 48  COGNITION:  Overall cognitive status: Within functional limits for tasks assessed     SENSATION:  Light touch: Appears intact  Proprioception: Appears intact  MUSCLE LENGTH: Bil hamstrings and adductors limited by 50%   FUNCTIONAL TESTS:  Difficulty with functional squat with demonstrated forward trunk, bil valgus but worse on Lt, and poor decent ability with slow ascent ability   GAIT: Distance walked: 250' Assistive device utilized: Lobbyist Level of assistance: Modified independence Comments: Lt AFO in place, Lt UE hand/wrist splint, noted decreased trunk rotation and arm swing, decreased step length and height at LT LE, decreased cadence               POSTURE: rounded shoulders, forward head, and posterior pelvic tilt    LUMBARAROM/PROM  A/PROM A/PROM  eval  Flexion Limited by 50%  Extension Limited by 25%  Right lateral flexion Limited by 25%  Left lateral flexion Limited by 25%  Right rotation Limited by 25%  Left rotation Limited by 25%   (Blank rows = not tested)  LOWER EXTREMITY ROM:  Bil WFL LOWER EXTREMITY MMT:  Lt hip 2+/5 in all directions, knee 3+/5; ankle not tested due to AFO in place Rt hip 3+/5 in all directions and knee 4/5, ankle 4+/5   PALPATION:   General  TTP throughout lower abdominal quadrants, worst over bladder and at anterior pelvis                External Perineal Exam TTP at Rt proximal adductor                             Internal Pelvic Floor TTP throughout superficial and deep muscle layers   Patient confirms identification and approves PT to assess internal pelvic floor and treatment Yes  PELVIC MMT:   MMT eval  Vaginal Initial attempts demonstrated heavy pushing/bearing down. Without this pt unable to activate pelvic floor 0/5  Internal Anal Sphincter   External Anal Sphincter   Puborectalis   Diastasis Recti   (Blank  rows = not tested)         TONE: Slightly increased   PROLAPSE: Grade 2 anterior wall laxity; grade 1 posterior wall laxity in hooklying (morning appt time) with strong cough  TODAY'S TREATMENT   10/26/22   Manual: Indirect fascial release and progressed to direct techniques for improved mobility throughout abdomen. Release work completed throughout with most tension felt in Rt lower quadrant and mid lower quadrant. Pt tolerated well and reported feeling much looser and had no spasms with this. Diaphragmatic breathing cued throughout manual for improved relaxation.  Single knee to chest 2x30s each Thoracic openers 2x30s each Butterfly stretch 2x30s    PATIENT EDUCATION:  Education details: prolapse relief positions, HEP Person educated: Patient Education method: Explanation, Demonstration, Tactile cues, Verbal cues, and Handouts Education comprehension: verbalized understanding and returned demonstration   HOME EXERCISE PROGRAM: [id number]  ASSESSMENT:  CLINICAL IMPRESSION: Patient reports she is seeing similar improvement to last session but had a really stressful holiday and thinks this caused her pain and tension to be worse and then this made her leakage a little worse. Pt session focused on relaxation and manual work at abdomen with pt reporting last treatment helped her a lot, reported decreased pain compared to start of session.  Pt would benefit from additional PT to further address deficits.       OBJECTIVE IMPAIRMENTS decreased coordination, decreased endurance, decreased mobility, difficulty walking, decreased strength, increased fascial restrictions, increased muscle spasms, impaired flexibility, improper body mechanics, postural dysfunction, and pain.   ACTIVITY LIMITATIONS carrying, lifting, standing, squatting, continence, and locomotion level  PARTICIPATION LIMITATIONS: cleaning, laundry, interpersonal relationship, community activity, occupation, and yard work  PERSONAL FACTORS Fitness,  Time since onset of injury/illness/exacerbation, and 1 comorbidity: medical history   are also affecting patient's functional outcome.   REHAB POTENTIAL: Good  CLINICAL DECISION MAKING: Evolving/moderate complexity  EVALUATION COMPLEXITY: Moderate   GOALS: Goals reviewed with patient? Yes  SHORT TERM GOALS: Target date: 07/22/2022  Pt to be I with HEP.  Baseline: Goal status: MET  2.  Pt will have 25% less urgency due to bladder retraining and strengthening  Baseline:  Goal status: MET   3.  Pt to report improved time between bladder voids to at least 1 hours for improved QOL with decreased urinary frequency.   Baseline:  Goal status: ON GOING  4.  Pt to demonstrate at least 2/5 pelvic floor strength for improved pelvic stability and decreased strain at pelvic floor/ decrease leakage.  Baseline:  Goal status: ON GOING - focus of treatment has been on relaxing pelvic floor to allow full muscle mobility for improved strength training  5.  Pt will report her BMs and bladder voids are more complete at least 50% of the time due to improved bowel habits and evacuation techniques.  Baseline:  Goal status: MET   LONG TERM GOALS: Target date:  09/24/22  >11/08/22  Pt to be I with advanced HEP.  Baseline:  Goal status: MET  2.  Pt will have 50% less urgency due to bladder retraining and strengthening  Baseline:  Goal status: on going  3.  Pt to report improved time between bladder voids to at least 2 hours for improved QOL with decreased urinary frequency.   Baseline:  Goal status: on going  4.  Pt to demonstrate at least 3/5 pelvic floor strength for improved pelvic stability and decreased strain at pelvic floor/ decrease leakage.  Baseline:  Goal status: on going - focus of sessions  have been to improve pain levels and muscle relaxation first then progress with strengthening  5.  Pt will report her BMs and bladder voids are more complete at least 75% of the time due to  improved bowel habits and evacuation techniques.  Baseline:  Goal status: MET  6.  Pt to demonstrate improved coordination of breathing mechanics and pelvic floor mobility for squatting 10# without leakage for improved pelvic stability.  Baseline:  Goal status: on going  PLAN: PT FREQUENCY: every other week  PT DURATION:  8 sessions  PLANNED INTERVENTIONS: Therapeutic exercises, Therapeutic activity, Neuromuscular re-education, Patient/Family education, Self Care, Joint mobilization, Aquatic Therapy, Dry Needling, Spinal mobilization, Cryotherapy, Moist heat, scar mobilization, Taping, Biofeedback, and Manual therapy  PLAN FOR NEXT SESSION: manual abdomen, hip and spine stretching, breathing and voiding mechanics, core strengthening,   Stacy Gardner, PT, DPT 12/27/232:47 PM

## 2022-10-31 DIAGNOSIS — G8194 Hemiplegia, unspecified affecting left nondominant side: Secondary | ICD-10-CM | POA: Diagnosis not present

## 2022-10-31 DIAGNOSIS — R7303 Prediabetes: Secondary | ICD-10-CM

## 2022-10-31 DIAGNOSIS — G459 Transient cerebral ischemic attack, unspecified: Secondary | ICD-10-CM | POA: Diagnosis not present

## 2022-10-31 HISTORY — DX: Prediabetes: R73.03

## 2022-11-04 DIAGNOSIS — F319 Bipolar disorder, unspecified: Secondary | ICD-10-CM | POA: Diagnosis not present

## 2022-11-07 ENCOUNTER — Ambulatory Visit: Payer: Medicaid Other | Attending: Urology | Admitting: Physical Therapy

## 2022-11-07 DIAGNOSIS — R279 Unspecified lack of coordination: Secondary | ICD-10-CM | POA: Insufficient documentation

## 2022-11-07 DIAGNOSIS — M6281 Muscle weakness (generalized): Secondary | ICD-10-CM | POA: Insufficient documentation

## 2022-11-07 DIAGNOSIS — M62838 Other muscle spasm: Secondary | ICD-10-CM | POA: Diagnosis present

## 2022-11-07 DIAGNOSIS — R293 Abnormal posture: Secondary | ICD-10-CM | POA: Insufficient documentation

## 2022-11-07 NOTE — Therapy (Addendum)
OUTPATIENT PHYSICAL THERAPY FEMALE PELVIC TREATMENT   Patient Name: Grace Sheppard MRN: [id number] DOB:01-17-1996, 27 y.o., female Today's Date: 11/07/2022   PT End of Session - 11/07/22 0840     Visit Number 9   6 visits from 11/9 (1 more)   Number of Visits --    Date for PT Re-Evaluation 02/06/23    Authorization Type [employer] medicaid    Authorization Time Period --    Authorization - Visit Number --    Authorization - Number of Visits --    PT Start Time 0845    PT Stop Time 0925    PT Time Calculation (min) 40 min    Activity Tolerance Patient tolerated treatment well    Behavior During Therapy [facility] for tasks assessed/performed             Past Medical History:  Diagnosis Date   Anxiety    Conversion disorder    Neuropathy    Post partum depression    Pseudoseizures    Syncope    recurrent   Past Surgical History:  Procedure Laterality Date   CHOLECYSTECTOMY     TUBAL LIGATION     Patient Active Problem List   Diagnosis Date Noted   Left spastic hemiparesis ([place]) 06/13/2022   Cancer of the bladder, stage IV ([place]) 05/09/2019   Mild persistent asthma with acute exacerbation 04/11/2019   Anxiety and depression 01/01/2018   Conversion reaction 07/15/2016    PCP: Joya Gaskins, FNP  REFERRING PROVIDER: Bjorn Loser, MD  REFERRING DIAG: N39.46 (ICD-10-CM) - Mixed incontinence R35.0 (ICD-10-CM) - Frequency of micturition  THERAPY DIAG:  Muscle weakness (generalized)  Unspecified lack of coordination  Abnormal posture  Other muscle spasm  Rationale for Evaluation and Treatment Rehabilitation  ONSET DATE: 2-3 years for leakage; constipation for many years  SUBJECTIVE:                                                                                                                                                                                           SUBJECTIVE STATEMENT: Pt reports pelvic pain has been better overall and has been using wand  which helps but feels like cramping/spasms. Urine - increased leakage over the last week with pressure felt per pt, going every 1.5 hours. Constipation has improved to 2 per week now consistently, massage has been helpful with this.    Fluid intake: Yes: water, cranberry juice, ginger ale are only drinks   07/28/2022 "pretty much only water now" 11/07/22 - 80 to 90 oz water per day   PAIN:  Are you having pain? Yes NPRS scale: 4/10 Pain  location:  pelvic area    PRECAUTIONS: None   PATIENT GOALS to have less pain or leakage  PERTINENT HISTORY:  FNS/Conversion Disorder, grade 2 cystocele, mild rectocele, mixed incontinence, mild bedwetting, recurrent UTIs, chronic pelvic pain, possible IC, fibromyalgia, RA, depression, anxiety, seizure disorder, acute gastric ulcer with hemorrhage   Sexual abuse: No  BOWEL MOVEMENT Pain with bowel movement: No Type of bowel movement:Type (Bristol Stool Scale) 1-2, Frequency once per week, and Strain Yes Fully empty rectum: No Leakage: No Pads: No Fiber supplement: Yes: miralax, Linzess, stool softener   URINATION Pain with urination: Yes Fully empty bladder: No Stream: Strong and Weak Urgency: Yes:   Frequency: varies could be every 15 minutes to two hours; nighttime sometimes more than 4x Leakage: Urge to void, Walking to the bathroom, Coughing, Sneezing, Laughing, and Exercise Pads: Yes: changes 4-5 per day  INTERCOURSE Pain with intercourse: Initial Penetration, During Penetration, and After Intercourse Ability to have vaginal penetration:  Yes: but painful Climax: sometimes painful but able to achieve  Marinoff Scale: 1/3  PREGNANCY Vaginal deliveries 2 Tearing Yes: tearing with first C-section deliveries 0 Currently pregnant No  PROLAPSE Bulge/heaviness fairly constantly    OBJECTIVE:   DIAGNOSTIC FINDINGS:    PATIENT SURVEYS:   PFIQ-7:  76 09/08/22: 48 11/07/22: 56  COGNITION:  Overall cognitive status: Within  functional limits for tasks assessed     SENSATION:  Light touch: Appears intact  Proprioception: Appears intact  MUSCLE LENGTH: Bil hamstrings and adductors limited by 50%   FUNCTIONAL TESTS:  Difficulty with functional squat with demonstrated forward trunk, bil valgus but worse on Lt, and poor decent ability with slow ascent ability   GAIT: Distance walked: 250' Assistive device utilized: Lobbyist Level of assistance: Modified independence Comments: Lt AFO in place, Lt UE hand/wrist splint, noted decreased trunk rotation and arm swing, decreased step length and height at LT LE, decreased cadence               POSTURE: rounded shoulders, forward head, and posterior pelvic tilt    LUMBARAROM/PROM  A/PROM A/PROM  eval  Flexion Limited by 50%  Extension Limited by 25%  Right lateral flexion Limited by 25%  Left lateral flexion Limited by 25%  Right rotation Limited by 25%  Left rotation Limited by 25%   (Blank rows = not tested)  LOWER EXTREMITY ROM:  Bil WFL LOWER EXTREMITY MMT:  Lt hip 2+/5 in all directions, knee 3+/5; ankle not tested due to AFO in place Rt hip 3+/5 in all directions and knee 4/5, ankle 4+/5   PALPATION:   General  TTP throughout lower abdominal quadrants, worst over bladder and at anterior pelvis                External Perineal Exam TTP at Rt proximal adductor                             Internal Pelvic Floor TTP throughout superficial and deep muscle layers   Patient confirms identification and approves PT to assess internal pelvic floor and treatment Yes  PELVIC MMT:   MMT eval 11/07/22   Vaginal Initial attempts demonstrated heavy pushing/bearing down. Without this pt unable to activate pelvic floor 0/5 2/5 with quick release techniques, 1/5 without these. However no downward pressure noted and minimal cues needed.   Internal Anal Sphincter    External Anal Sphincter    Puborectalis  Diastasis Recti    (Blank rows = not  tested)        TONE: Slightly increased   PROLAPSE: Grade 2 anterior wall laxity; grade 1 posterior wall laxity in hooklying (morning appt time) with strong cough  TODAY'S TREATMENT   11/07/22:  Pt consented to internal vaginal treatment this date and found to have improved ability to contract pelvic floor, no downward pressure with attempts, and overall greatly improved tolerance to palpation and manual work.  Manual - focus at Rt side pubococcygeus and obturator internus with noted trigger points here and pt reporting she felt this was where she feels "pressure". Moderate release felt my PT with improved muscle relaxation and mobility noted after manual work and pt reported she felt greatly less pressure than prior to PT today.  NMRE- attempted to contract pelvic floor with improved mobility of tissues with pt able to have 2/5 with quick release technique and no downward pressure noted with this. Pt also able to relax pelvic floor between x5 contractions and no increased pain.      PATIENT EDUCATION:  Education details: prolapse relief positions, HEP Person educated: Patient Education method: Explanation, Demonstration, Tactile cues, Verbal cues, and Handouts Education comprehension: verbalized understanding and returned demonstration   HOME EXERCISE PROGRAM: [id number]  ASSESSMENT:  CLINICAL IMPRESSION: Patient reports she has been having overall improved symptoms in urine, bowel movements, and pain/pressure since starting PT however over the past couple weeks has been having an increase in pressure feeling and pelvic wand has been helping but not resolved and with this usually causes her to have increased urinary urgency and leakage. Pt tolerated session well with manual and NMRE work today for improved pelvic floor mobility and decreased pain. Pt still sees PT for LT side arm and leg strength and mobility. Pt does have upcoming appt with MD to discuss referral for pessary and  continued pain with bladder spasms and would like to wait until after seeing him before scheduling additional visits. Then will call to schedule.   Pt would benefit from additional PT to further address deficits.       OBJECTIVE IMPAIRMENTS decreased coordination, decreased endurance, decreased mobility, difficulty walking, decreased strength, increased fascial restrictions, increased muscle spasms, impaired flexibility, improper body mechanics, postural dysfunction, and pain.   ACTIVITY LIMITATIONS carrying, lifting, standing, squatting, continence, and locomotion level  PARTICIPATION LIMITATIONS: cleaning, laundry, interpersonal relationship, community activity, occupation, and yard work  PERSONAL FACTORS Fitness, Time since onset of injury/illness/exacerbation, and 1 comorbidity: medical history   are also affecting patient's functional outcome.   REHAB POTENTIAL: Good  CLINICAL DECISION MAKING: Evolving/moderate complexity  EVALUATION COMPLEXITY: Moderate   GOALS: Goals reviewed with patient? Yes  SHORT TERM GOALS: Target date: 07/22/2022  Pt to be I with HEP.  Baseline: Goal status: MET  2.  Pt will have 25% less urgency due to bladder retraining and strengthening  Baseline:  Goal status: MET   3.  Pt to report improved time between bladder voids to at least 1 hours for improved QOL with decreased urinary frequency.   Baseline:  Goal status: MET  4.  Pt to demonstrate at least 2/5 pelvic floor strength for improved pelvic stability and decreased strain at pelvic floor/ decrease leakage.  Baseline:  Goal status: MET  5.  Pt will report her BMs and bladder voids are more complete at least 50% of the time due to improved bowel habits and evacuation techniques.  Baseline:  Goal status: MET   LONG  TERM GOALS: Target date:  09/24/22  >11/08/22  Pt to be I with advanced HEP.  Baseline:  Goal status: MET  2.  Pt will have 50% less urgency due to bladder retraining and  strengthening  Baseline:  Goal status: on going  3.  Pt to report improved time between bladder voids to at least 2 hours for improved QOL with decreased urinary frequency.   Baseline:  Goal status: on going  4.  Pt to demonstrate at least 3/5 pelvic floor strength for improved pelvic stability and decreased strain at pelvic floor/ decrease leakage.  Baseline:  Goal status: on going - focus of sessions have been to improve pain levels and muscle relaxation first then progress with strengthening  5.  Pt will report her BMs and bladder voids are more complete at least 75% of the time due to improved bowel habits and evacuation techniques.  Baseline:  Goal status: MET  6.  Pt to demonstrate improved coordination of breathing mechanics and pelvic floor mobility for squatting 10# without leakage for improved pelvic stability.  Baseline:  Goal status: on going  PLAN: PT FREQUENCY: every other week  PT DURATION:  8 sessions  PLANNED INTERVENTIONS: Therapeutic exercises, Therapeutic activity, Neuromuscular re-education, Patient/Family education, Self Care, Joint mobilization, Aquatic Therapy, Dry Needling, Spinal mobilization, Cryotherapy, Moist heat, scar mobilization, Taping, Biofeedback, and Manual therapy  PLAN FOR NEXT SESSION: manual abdomen, hip and spine stretching, breathing and voiding mechanics, core strengthening,   Otelia Sergeant, PT, DPT 01/08/249:30 AM   PHYSICAL THERAPY DISCHARGE SUMMARY  Visits from Start of Care: 9  Current functional level related to goals / functional outcomes: Unable to reassess formally as pt has not returned since last visit   Remaining deficits: Unable to formally reassess    Education / Equipment: HEP   Patient agrees to discharge. Patient goals were partially met. Patient is being discharged due to not returning since the last visit.   Otelia Sergeant, PT, DPT 02/13/2410:06 AM

## 2022-11-11 DIAGNOSIS — F329 Major depressive disorder, single episode, unspecified: Secondary | ICD-10-CM | POA: Diagnosis not present

## 2022-11-22 DIAGNOSIS — K0889 Other specified disorders of teeth and supporting structures: Secondary | ICD-10-CM | POA: Diagnosis not present

## 2022-11-29 DIAGNOSIS — B349 Viral infection, unspecified: Secondary | ICD-10-CM | POA: Diagnosis not present

## 2022-11-29 DIAGNOSIS — R1031 Right lower quadrant pain: Secondary | ICD-10-CM | POA: Diagnosis not present

## 2022-11-29 DIAGNOSIS — K529 Noninfective gastroenteritis and colitis, unspecified: Secondary | ICD-10-CM | POA: Diagnosis not present

## 2022-12-01 DIAGNOSIS — G459 Transient cerebral ischemic attack, unspecified: Secondary | ICD-10-CM | POA: Diagnosis not present

## 2022-12-01 DIAGNOSIS — G8194 Hemiplegia, unspecified affecting left nondominant side: Secondary | ICD-10-CM | POA: Diagnosis not present

## 2022-12-07 DIAGNOSIS — G4700 Insomnia, unspecified: Secondary | ICD-10-CM | POA: Diagnosis not present

## 2022-12-07 DIAGNOSIS — F32A Depression, unspecified: Secondary | ICD-10-CM | POA: Diagnosis not present

## 2022-12-07 DIAGNOSIS — F419 Anxiety disorder, unspecified: Secondary | ICD-10-CM | POA: Diagnosis not present

## 2022-12-07 DIAGNOSIS — F4310 Post-traumatic stress disorder, unspecified: Secondary | ICD-10-CM | POA: Diagnosis not present

## 2022-12-07 DIAGNOSIS — F449 Dissociative and conversion disorder, unspecified: Secondary | ICD-10-CM | POA: Diagnosis not present

## 2022-12-14 DIAGNOSIS — B370 Candidal stomatitis: Secondary | ICD-10-CM | POA: Diagnosis not present

## 2022-12-14 DIAGNOSIS — J45901 Unspecified asthma with (acute) exacerbation: Secondary | ICD-10-CM | POA: Diagnosis not present

## 2022-12-19 DIAGNOSIS — F4312 Post-traumatic stress disorder, chronic: Secondary | ICD-10-CM | POA: Diagnosis not present

## 2022-12-19 DIAGNOSIS — F32A Depression, unspecified: Secondary | ICD-10-CM | POA: Diagnosis not present

## 2022-12-19 DIAGNOSIS — Z79899 Other long term (current) drug therapy: Secondary | ICD-10-CM | POA: Diagnosis not present

## 2022-12-19 DIAGNOSIS — F411 Generalized anxiety disorder: Secondary | ICD-10-CM | POA: Diagnosis not present

## 2022-12-22 DIAGNOSIS — R7309 Other abnormal glucose: Secondary | ICD-10-CM | POA: Diagnosis not present

## 2022-12-22 DIAGNOSIS — E538 Deficiency of other specified B group vitamins: Secondary | ICD-10-CM | POA: Diagnosis not present

## 2022-12-22 DIAGNOSIS — I10 Essential (primary) hypertension: Secondary | ICD-10-CM | POA: Diagnosis not present

## 2022-12-22 DIAGNOSIS — E041 Nontoxic single thyroid nodule: Secondary | ICD-10-CM | POA: Diagnosis not present

## 2022-12-22 DIAGNOSIS — K1379 Other lesions of oral mucosa: Secondary | ICD-10-CM | POA: Diagnosis not present

## 2022-12-26 DIAGNOSIS — E041 Nontoxic single thyroid nodule: Secondary | ICD-10-CM | POA: Diagnosis not present

## 2022-12-27 DIAGNOSIS — U071 COVID-19: Secondary | ICD-10-CM | POA: Diagnosis not present

## 2022-12-27 DIAGNOSIS — R059 Cough, unspecified: Secondary | ICD-10-CM | POA: Diagnosis not present

## 2022-12-27 DIAGNOSIS — J40 Bronchitis, not specified as acute or chronic: Secondary | ICD-10-CM | POA: Diagnosis not present

## 2022-12-30 DIAGNOSIS — G8194 Hemiplegia, unspecified affecting left nondominant side: Secondary | ICD-10-CM | POA: Diagnosis not present

## 2022-12-30 DIAGNOSIS — G459 Transient cerebral ischemic attack, unspecified: Secondary | ICD-10-CM | POA: Diagnosis not present

## 2023-01-02 DIAGNOSIS — F4312 Post-traumatic stress disorder, chronic: Secondary | ICD-10-CM | POA: Diagnosis not present

## 2023-01-02 DIAGNOSIS — F32A Depression, unspecified: Secondary | ICD-10-CM | POA: Diagnosis not present

## 2023-01-02 DIAGNOSIS — F411 Generalized anxiety disorder: Secondary | ICD-10-CM | POA: Diagnosis not present

## 2023-01-03 DIAGNOSIS — J020 Streptococcal pharyngitis: Secondary | ICD-10-CM | POA: Diagnosis not present

## 2023-01-03 DIAGNOSIS — F4310 Post-traumatic stress disorder, unspecified: Secondary | ICD-10-CM | POA: Diagnosis not present

## 2023-01-03 DIAGNOSIS — J209 Acute bronchitis, unspecified: Secondary | ICD-10-CM | POA: Diagnosis not present

## 2023-01-03 DIAGNOSIS — J029 Acute pharyngitis, unspecified: Secondary | ICD-10-CM | POA: Diagnosis not present

## 2023-01-09 DIAGNOSIS — M2392 Unspecified internal derangement of left knee: Secondary | ICD-10-CM | POA: Diagnosis not present

## 2023-01-09 DIAGNOSIS — M25462 Effusion, left knee: Secondary | ICD-10-CM | POA: Diagnosis not present

## 2023-01-09 DIAGNOSIS — S83282A Other tear of lateral meniscus, current injury, left knee, initial encounter: Secondary | ICD-10-CM | POA: Diagnosis not present

## 2023-01-12 DIAGNOSIS — I1A0 Resistant hypertension: Secondary | ICD-10-CM | POA: Diagnosis not present

## 2023-01-16 DIAGNOSIS — F3181 Bipolar II disorder: Secondary | ICD-10-CM | POA: Diagnosis not present

## 2023-01-25 DIAGNOSIS — I1A0 Resistant hypertension: Secondary | ICD-10-CM | POA: Diagnosis not present

## 2023-01-26 DIAGNOSIS — M62838 Other muscle spasm: Secondary | ICD-10-CM | POA: Diagnosis not present

## 2023-01-30 DIAGNOSIS — G8194 Hemiplegia, unspecified affecting left nondominant side: Secondary | ICD-10-CM | POA: Diagnosis not present

## 2023-01-30 DIAGNOSIS — G459 Transient cerebral ischemic attack, unspecified: Secondary | ICD-10-CM | POA: Diagnosis not present

## 2023-02-01 DIAGNOSIS — N390 Urinary tract infection, site not specified: Secondary | ICD-10-CM | POA: Diagnosis not present

## 2023-02-01 DIAGNOSIS — N3946 Mixed incontinence: Secondary | ICD-10-CM | POA: Diagnosis not present

## 2023-02-04 DIAGNOSIS — F4310 Post-traumatic stress disorder, unspecified: Secondary | ICD-10-CM | POA: Diagnosis not present

## 2023-02-06 DIAGNOSIS — E538 Deficiency of other specified B group vitamins: Secondary | ICD-10-CM | POA: Diagnosis not present

## 2023-02-07 DIAGNOSIS — I10 Essential (primary) hypertension: Secondary | ICD-10-CM | POA: Diagnosis not present

## 2023-02-09 DIAGNOSIS — I1A0 Resistant hypertension: Secondary | ICD-10-CM | POA: Diagnosis not present

## 2023-02-09 DIAGNOSIS — I10 Essential (primary) hypertension: Secondary | ICD-10-CM | POA: Diagnosis not present

## 2023-02-13 DIAGNOSIS — F32A Depression, unspecified: Secondary | ICD-10-CM | POA: Diagnosis not present

## 2023-02-13 DIAGNOSIS — F411 Generalized anxiety disorder: Secondary | ICD-10-CM | POA: Diagnosis not present

## 2023-02-13 DIAGNOSIS — Z87891 Personal history of nicotine dependence: Secondary | ICD-10-CM | POA: Diagnosis not present

## 2023-02-13 DIAGNOSIS — F4312 Post-traumatic stress disorder, chronic: Secondary | ICD-10-CM | POA: Diagnosis not present

## 2023-02-13 DIAGNOSIS — I10 Essential (primary) hypertension: Secondary | ICD-10-CM | POA: Diagnosis not present

## 2023-02-16 DIAGNOSIS — N3946 Mixed incontinence: Secondary | ICD-10-CM | POA: Diagnosis not present

## 2023-02-16 DIAGNOSIS — R1031 Right lower quadrant pain: Secondary | ICD-10-CM | POA: Diagnosis not present

## 2023-02-16 DIAGNOSIS — N390 Urinary tract infection, site not specified: Secondary | ICD-10-CM | POA: Diagnosis not present

## 2023-02-27 DIAGNOSIS — F411 Generalized anxiety disorder: Secondary | ICD-10-CM | POA: Diagnosis not present

## 2023-02-27 DIAGNOSIS — F32A Depression, unspecified: Secondary | ICD-10-CM | POA: Diagnosis not present

## 2023-03-01 DIAGNOSIS — G459 Transient cerebral ischemic attack, unspecified: Secondary | ICD-10-CM | POA: Diagnosis not present

## 2023-03-01 DIAGNOSIS — G8194 Hemiplegia, unspecified affecting left nondominant side: Secondary | ICD-10-CM | POA: Diagnosis not present

## 2023-03-09 ENCOUNTER — Ambulatory Visit (INDEPENDENT_AMBULATORY_CARE_PROVIDER_SITE_OTHER): Payer: Medicaid Other | Admitting: Nurse Practitioner

## 2023-03-09 ENCOUNTER — Institutional Professional Consult (permissible substitution): Payer: Medicaid Other | Admitting: Nurse Practitioner

## 2023-03-09 ENCOUNTER — Encounter: Payer: Self-pay | Admitting: Nurse Practitioner

## 2023-03-09 VITALS — BP 142/82 | HR 82 | Temp 98.4°F | Ht 64.0 in | Wt 215.6 lb

## 2023-03-09 DIAGNOSIS — G4700 Insomnia, unspecified: Secondary | ICD-10-CM | POA: Diagnosis not present

## 2023-03-09 DIAGNOSIS — G4719 Other hypersomnia: Secondary | ICD-10-CM | POA: Diagnosis not present

## 2023-03-09 DIAGNOSIS — E669 Obesity, unspecified: Secondary | ICD-10-CM

## 2023-03-09 DIAGNOSIS — E538 Deficiency of other specified B group vitamins: Secondary | ICD-10-CM | POA: Diagnosis not present

## 2023-03-09 NOTE — Assessment & Plan Note (Signed)
BMI 37. Healthy weight loss encouraged.  

## 2023-03-09 NOTE — Assessment & Plan Note (Signed)
She has excessive daytime sleepiness, chronic headaches, restless sleep. BMI 37. History of HTN. Epworth 15. Given this,  I am concerned she could have sleep disordered breathing with obstructive sleep apnea. She will need sleep study for further evaluation.    - discussed how weight can impact sleep and risk for sleep disordered breathing - discussed options to assist with weight loss: combination of diet modification, cardiovascular and strength training exercises   - had an extensive discussion regarding the adverse health consequences related to untreated sleep disordered breathing - specifically discussed the risks for hypertension, coronary artery disease, cardiac dysrhythmias, cerebrovascular disease, and diabetes - lifestyle modification discussed   - discussed how sleep disruption can increase risk of accidents, particularly when driving - safe driving practices were discussed  Patient Instructions  Given your symptoms, I am concerned that you may have sleep disordered breathing with sleep apnea. You will need a sleep study for further evaluation. Someone will contact you to schedule this.   We discussed how untreated sleep apnea puts an individual at risk for cardiac arrhthymias, pulm HTN, DM, stroke and increases their risk for daytime accidents. We also briefly reviewed treatment options including weight loss, side sleeping position, oral appliance, CPAP therapy or referral to ENT for possible surgical options  Use caution when driving and pull over if you become sleepy.  Follow up in 6 weeks with Katie Rodolph Hagemann,NP to go over sleep study results, or sooner, if needed

## 2023-03-09 NOTE — Assessment & Plan Note (Signed)
Likely multifactorial. Possible untreated OSA contributing. See above plan. Continue working with psychiatry on pharmacological therapies.

## 2023-03-09 NOTE — Progress Notes (Signed)
@Patient  ID: Grace Sheppard, female    DOB: Oct 23, 1996, 27 y.o.   MRN: [id number]  Chief Complaint  Patient presents with   Consult    High blood pressure, never had sleep study before.     Referring provider: Trisha Mangle, *  HPI: 27 year old female, former smoker referred for sleep consult. Past medical history significant for mild asthma, left spastic hemiparesis, bladder cancer, PTSD, anxiety, depression.   TEST/EVENTS:   03/09/2023: Today - sleep consult Patient presents today for sleep consult, referred by Grace Bushy, NP. She has had trouble with her sleep for years. She related this to her anxiety/PTSD but recently, they have been having trouble controlling her BP so her PCP recommended she have sleep evaluation to rule out sleep apnea. She tells me that she has restless sleep at night.  Wakes feeling tired in the morning.  She is fatigued during the day and feels like her energy levels are terrible.  She has trouble with chronic headaches.  She has difficulties falling and staying asleep.  They are working to adjust her medicine.  She is currently on trazodone and prazsosin which does help some.  The plan is to discontinue the trazodone and Wellbutrin, and switch her to Seroquel.  She denies any reports of snoring or witnessed apneas.  No sleep parasomnia/paralysis.  Does not have a history of narcolepsy or symptoms of cataplexy. Goes to bed between 9 to 11 PM.  Usually takes an hour or more to fall asleep.  Wakes a few times at night.  Usually gets up around 3 AM.  She is working on disability.  Rarely drives by herself.  Does not have any drowsy driving.  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  She is gained around 60 pounds over the last 2 years.  Never had a previous sleep study. She has a history of high blood pressure and chronic headaches.  No history of diabetes or stroke.  She has mild asthma, well-controlled with Flovent.  She is a former remote smoker, quit  2015.  Does not drink any alcohol.  No excessive caffeine intake.  Lives at home with her parents and children. Wakes feeling tired; fatigued during the day; more trouble falling asleep; chronic headaches Prazasoin - helps with nightmares  She does drive but not usually by herself   Epworth 15  Allergies  Allergen Reactions   Amoxicillin Rash   Penicillin G Rash    Immunization History  Administered Date(s) Administered   Moderna Sars-Covid-2 Vaccination 01/25/2020, 02/22/2020   Tdap 03/06/2015, 03/06/2015    Past Medical History:  Diagnosis Date   Anxiety    Conversion disorder    Neuropathy    Post partum depression    Pseudoseizures    Syncope    recurrent    Tobacco History: Social History   Tobacco Use  Smoking Status Former   Packs/day: 0.02   Years: 1.50   Additional pack years: 0.00   Total pack years: 0.03   Types: Cigarettes   Quit date: 12/29/2013   Years since quitting: 9.1   Passive exposure: Current  Smokeless Tobacco Never   Counseling given: Not Answered   Outpatient Medications Prior to Visit  Medication Sig Dispense Refill   buPROPion (WELLBUTRIN XL) 150 MG 24 hr tablet Take 150 mg by mouth every morning.     butalbital-acetaminophen-caffeine (FIORICET) 50-325-40 MG tablet Take by mouth every 4 (four) hours as needed.     cyclobenzaprine (FLEXERIL)  10 MG tablet Take 10 mg by mouth 3 (three) times daily as needed.     FLOVENT HFA 44 MCG/ACT inhaler Inhale 2 puffs into the lungs 2 (two) times daily.     fluticasone (FLONASE) 50 MCG/ACT nasal spray Place 2 sprays into both nostrils daily.     gabapentin (NEURONTIN) 300 MG capsule Take 300 mg by mouth 3 (three) times daily.     levonorgestrel (MIRENA) 20 MCG/DAY IUD 1 each by Intrauterine route once.     LINZESS 145 MCG CAPS capsule Take 145 mcg by mouth daily.     lisinopril (ZESTRIL) 20 MG tablet Take 20 mg by mouth daily.     meloxicam (MOBIC) 15 MG tablet Take 1 tablet (15 mg total) by mouth  daily. 30 tablet 1   metoprolol succinate (TOPROL-XL) 25 MG 24 hr tablet Take 25 mg by mouth daily.     nortriptyline (PAMELOR) 25 MG capsule Take 25 mg by mouth at bedtime.     ondansetron (ZOFRAN-ODT) 4 MG disintegrating tablet Take 4 mg by mouth every 8 (eight) hours as needed.     prazosin (MINIPRESS) 2 MG capsule Take 4 mg by mouth at bedtime.     rizatriptan (MAXALT) 5 MG tablet SMARTSIG:1 Tablet(s) By Mouth 1 to 2 Times Daily     Selenium 200 MCG CAPS Take 200 mcg by mouth daily.     traZODone (DESYREL) 50 MG tablet Take 50 mg by mouth at bedtime.     trimethoprim (TRIMPEX) 100 MG tablet Take 100 mg by mouth daily.     Facility-Administered Medications Prior to Visit  Medication Dose Route Frequency Provider Last Rate Last Admin   betamethasone acetate-betamethasone sodium phosphate (CELESTONE) injection 3 mg  3 mg Intra-articular Once Felecia Shelling, DPM         Review of Systems:   Constitutional: No night sweats, fevers, chills,  or lassitude. +fatigue, weight gain HEENT: No difficulty swallowing, tooth/dental problems, or sore throat. No sneezing, itching, ear ache, nasal congestion, or post nasal drip. +headaches CV:  No chest pain, orthopnea, PND, swelling in lower extremities, anasarca, dizziness, palpitations, syncope Resp: +shortness of breath with exertion (baseline); occasional cough (baseline). No excess mucus or change in color of mucus. No hemoptysis. No wheezing.  No chest wall deformity GI:  No heartburn, indigestion, abdominal pain, nausea, vomiting, diarrhea, change in bowel habits, loss of appetite, bloody stools.  GU: No dysuria, change in color of urine, urgency or frequency.  No flank pain, no hematuria  Skin: No rash, lesions, ulcerations MSK:  No joint pain or swelling.  No decreased range of motion.  No back pain. Neuro: No dizziness or lightheadedness.  Psych: +depression, anxiety. No SI/HI. Mood stable. +sleep disturbance     Physical Exam:  BP (!)  142/82   Pulse 82   Temp 98.4 F (36.9 C)   Ht 5\' 4"  (1.626 m)   Wt 215 lb 9.6 oz (97.8 kg)   SpO2 95%   BMI 37.01 kg/m   GEN: Pleasant, interactive, well-kempt; obese; in no acute distress. HEENT:  Normocephalic and atraumatic. PERRLA. Sclera white. Nasal turbinates pink, moist and patent bilaterally. No rhinorrhea present. Oropharynx pink and moist, without exudate or edema. No lesions, ulcerations, or postnasal drip. Mallampati III NECK:  Supple w/ fair ROM. No JVD present. Normal carotid impulses w/o bruits. Thyroid symmetrical with no goiter or nodules palpated. No lymphadenopathy.   CV: RRR, no m/r/g, no peripheral edema. Pulses intact, +2 bilaterally. No cyanosis,  pallor or clubbing. PULMONARY:  Unlabored, regular breathing. Clear bilaterally A&P w/o wheezes/rales/rhonchi. No accessory muscle use.  GI: BS present and normoactive. Soft, non-tender to palpation. No organomegaly or masses detected.  MSK: No erythema, warmth or tenderness. Cap refil <2 sec all extrem. No deformities or joint swelling noted.  Neuro: A/Ox3. No focal deficits noted.   Skin: Warm, no lesions or rashe Psych: Normal affect and behavior. Judgement and thought content appropriate.     Lab Results:  CBC    Component Value Date/Time   WBC 11.1 (H) 01/01/2018 1641   WBC 4.7 11/22/2016 2013   RBC 5.00 01/01/2018 1641   RBC 4.46 11/22/2016 2013   HGB 15.2 01/01/2018 1641   HCT 45.8 01/01/2018 1641   PLT 372 01/01/2018 1641   MCV 92 01/01/2018 1641   MCH 30.4 01/01/2018 1641   MCH 28.7 11/22/2016 2013   MCHC 33.2 01/01/2018 1641   MCHC 32.9 11/22/2016 2013   RDW 13.1 01/01/2018 1641   LYMPHSABS 3.1 01/01/2018 1641   MONOABS 0.3 10/30/2016 1732   EOSABS 0.1 01/01/2018 1641   BASOSABS 0.0 01/01/2018 1641    BMET    Component Value Date/Time   NA 143 01/01/2018 1641   K 3.9 01/01/2018 1641   CL 100 01/01/2018 1641   CO2 23 01/01/2018 1641   GLUCOSE 84 01/01/2018 1641   GLUCOSE 62 (L)  01/07/2017 1119   BUN 11 01/01/2018 1641   CREATININE 0.72 01/01/2018 1641   CALCIUM 10.3 (H) 01/01/2018 1641   GFRNONAA 118 01/01/2018 1641   GFRAA 136 01/01/2018 1641    BNP No results found for: "BNP"   Imaging:  No results found.        No data to display          No results found for: "NITRICOXIDE"      Assessment & Plan:   Excessive daytime sleepiness She has excessive daytime sleepiness, chronic headaches, restless sleep. BMI 37. History of HTN. Epworth 15. Given this,  I am concerned she could have sleep disordered breathing with obstructive sleep apnea. She will need sleep study for further evaluation.    - discussed how weight can impact sleep and risk for sleep disordered breathing - discussed options to assist with weight loss: combination of diet modification, cardiovascular and strength training exercises   - had an extensive discussion regarding the adverse health consequences related to untreated sleep disordered breathing - specifically discussed the risks for hypertension, coronary artery disease, cardiac dysrhythmias, cerebrovascular disease, and diabetes - lifestyle modification discussed   - discussed how sleep disruption can increase risk of accidents, particularly when driving - safe driving practices were discussed  Patient Instructions  Given your symptoms, I am concerned that you may have sleep disordered breathing with sleep apnea. You will need a sleep study for further evaluation. Someone will contact you to schedule this.   We discussed how untreated sleep apnea puts an individual at risk for cardiac arrhthymias, pulm HTN, DM, stroke and increases their risk for daytime accidents. We also briefly reviewed treatment options including weight loss, side sleeping position, oral appliance, CPAP therapy or referral to ENT for possible surgical options  Use caution when driving and pull over if you become sleepy.  Follow up in 6 weeks with  Katie Ioan Landini,NP to go over sleep study results, or sooner, if needed     Insomnia Likely multifactorial. Possible untreated OSA contributing. See above plan. Continue working with psychiatry on pharmacological therapies.  Obesity (BMI 30-39.9) BMI 37. Healthy weight loss encourage.d    I spent 35 minutes of dedicated to the care of this patient on the date of this encounter to include pre-visit review of records, face-to-face time with the patient discussing conditions above, post visit ordering of testing, clinical documentation with the electronic health record, making appropriate referrals as documented, and communicating necessary findings to members of the patients care team.  Noemi Chapel, NP 03/09/2023  Pt aware and understands NP's role.

## 2023-03-09 NOTE — Patient Instructions (Addendum)
Given your symptoms, I am concerned that you may have sleep disordered breathing with sleep apnea. You will need a sleep study for further evaluation. Someone will contact you to schedule this.   We discussed how untreated sleep apnea puts an individual at risk for cardiac arrhthymias, pulm HTN, DM, stroke and increases their risk for daytime accidents. We also briefly reviewed treatment options including weight loss, side sleeping position, oral appliance, CPAP therapy or referral to ENT for possible surgical options  Use caution when driving and pull over if you become sleepy.  Follow up in 6 weeks with Katie Ashani Pumphrey,NP to go over sleep study results, or sooner, if needed   

## 2023-03-13 DIAGNOSIS — F32A Depression, unspecified: Secondary | ICD-10-CM | POA: Diagnosis not present

## 2023-03-13 DIAGNOSIS — F411 Generalized anxiety disorder: Secondary | ICD-10-CM | POA: Diagnosis not present

## 2023-03-15 ENCOUNTER — Ambulatory Visit: Payer: Medicaid Other

## 2023-03-15 DIAGNOSIS — G4719 Other hypersomnia: Secondary | ICD-10-CM

## 2023-03-15 DIAGNOSIS — G4710 Hypersomnia, unspecified: Secondary | ICD-10-CM | POA: Diagnosis not present

## 2023-03-22 ENCOUNTER — Telehealth: Payer: Self-pay | Admitting: Pulmonary Disease

## 2023-03-22 DIAGNOSIS — W19XXXA Unspecified fall, initial encounter: Secondary | ICD-10-CM | POA: Diagnosis not present

## 2023-03-22 DIAGNOSIS — R531 Weakness: Secondary | ICD-10-CM | POA: Diagnosis not present

## 2023-03-22 DIAGNOSIS — G4710 Hypersomnia, unspecified: Secondary | ICD-10-CM | POA: Diagnosis not present

## 2023-03-22 NOTE — Telephone Encounter (Signed)
Call patient  Sleep study result  Date of study: 03/15/2023  Impression: Negative study for significant sleep disordered breathing with an AHI of 1.5 No significant oxygen desaturations  Recommendation:  Current study is negative for significant sleep disordered breathing however if there remains significant concern for sleep disordered breathing, an in lab study may be considered  Negative home sleep study will suggest absence of moderate to severe disease  Encourage weight loss efforts  Optimize other conditions that may be contributing to nonrestorative sleep and daytime fatigue  Follow-up as previously scheduled

## 2023-03-24 NOTE — Telephone Encounter (Signed)
Patient does have a f/u with Katie for sleep study result's .

## 2023-04-01 DIAGNOSIS — G8194 Hemiplegia, unspecified affecting left nondominant side: Secondary | ICD-10-CM | POA: Diagnosis not present

## 2023-04-01 DIAGNOSIS — G459 Transient cerebral ischemic attack, unspecified: Secondary | ICD-10-CM | POA: Diagnosis not present

## 2023-04-10 DIAGNOSIS — F447 Conversion disorder with mixed symptom presentation: Secondary | ICD-10-CM | POA: Diagnosis not present

## 2023-04-10 DIAGNOSIS — G8114 Spastic hemiplegia affecting left nondominant side: Secondary | ICD-10-CM | POA: Diagnosis not present

## 2023-04-10 DIAGNOSIS — G8220 Paraplegia, unspecified: Secondary | ICD-10-CM | POA: Diagnosis not present

## 2023-04-10 DIAGNOSIS — E538 Deficiency of other specified B group vitamins: Secondary | ICD-10-CM | POA: Diagnosis not present

## 2023-04-11 DIAGNOSIS — M222X2 Patellofemoral disorders, left knee: Secondary | ICD-10-CM | POA: Diagnosis not present

## 2023-04-11 DIAGNOSIS — S83282A Other tear of lateral meniscus, current injury, left knee, initial encounter: Secondary | ICD-10-CM | POA: Diagnosis not present

## 2023-04-11 DIAGNOSIS — M25462 Effusion, left knee: Secondary | ICD-10-CM | POA: Diagnosis not present

## 2023-04-11 DIAGNOSIS — E538 Deficiency of other specified B group vitamins: Secondary | ICD-10-CM | POA: Diagnosis not present

## 2023-04-11 DIAGNOSIS — M25862 Other specified joint disorders, left knee: Secondary | ICD-10-CM | POA: Diagnosis not present

## 2023-04-11 DIAGNOSIS — M25562 Pain in left knee: Secondary | ICD-10-CM | POA: Diagnosis not present

## 2023-04-11 DIAGNOSIS — M25371 Other instability, right ankle: Secondary | ICD-10-CM | POA: Diagnosis not present

## 2023-04-13 DIAGNOSIS — F4312 Post-traumatic stress disorder, chronic: Secondary | ICD-10-CM | POA: Diagnosis not present

## 2023-04-13 DIAGNOSIS — F411 Generalized anxiety disorder: Secondary | ICD-10-CM | POA: Diagnosis not present

## 2023-04-13 DIAGNOSIS — F32A Depression, unspecified: Secondary | ICD-10-CM | POA: Diagnosis not present

## 2023-04-19 DIAGNOSIS — R1031 Right lower quadrant pain: Secondary | ICD-10-CM | POA: Diagnosis not present

## 2023-04-20 ENCOUNTER — Ambulatory Visit: Payer: Medicaid Other | Admitting: Nurse Practitioner

## 2023-04-26 DIAGNOSIS — R351 Nocturia: Secondary | ICD-10-CM | POA: Diagnosis not present

## 2023-04-26 DIAGNOSIS — N819 Female genital prolapse, unspecified: Secondary | ICD-10-CM | POA: Diagnosis not present

## 2023-04-26 DIAGNOSIS — R1031 Right lower quadrant pain: Secondary | ICD-10-CM | POA: Diagnosis not present

## 2023-04-26 DIAGNOSIS — R350 Frequency of micturition: Secondary | ICD-10-CM | POA: Diagnosis not present

## 2023-04-26 DIAGNOSIS — N3946 Mixed incontinence: Secondary | ICD-10-CM | POA: Diagnosis not present

## 2023-04-26 DIAGNOSIS — R8271 Bacteriuria: Secondary | ICD-10-CM | POA: Diagnosis not present

## 2023-05-01 DIAGNOSIS — G8194 Hemiplegia, unspecified affecting left nondominant side: Secondary | ICD-10-CM | POA: Diagnosis not present

## 2023-05-01 DIAGNOSIS — G459 Transient cerebral ischemic attack, unspecified: Secondary | ICD-10-CM | POA: Diagnosis not present

## 2023-05-08 ENCOUNTER — Other Ambulatory Visit: Payer: Self-pay | Admitting: Urology

## 2023-05-11 DIAGNOSIS — F4310 Post-traumatic stress disorder, unspecified: Secondary | ICD-10-CM | POA: Diagnosis not present

## 2023-05-11 DIAGNOSIS — F411 Generalized anxiety disorder: Secondary | ICD-10-CM | POA: Diagnosis not present

## 2023-05-11 DIAGNOSIS — F4312 Post-traumatic stress disorder, chronic: Secondary | ICD-10-CM | POA: Diagnosis not present

## 2023-05-11 DIAGNOSIS — F32A Depression, unspecified: Secondary | ICD-10-CM | POA: Diagnosis not present

## 2023-05-15 ENCOUNTER — Encounter (HOSPITAL_BASED_OUTPATIENT_CLINIC_OR_DEPARTMENT_OTHER): Payer: Self-pay | Admitting: Urology

## 2023-05-16 ENCOUNTER — Encounter (HOSPITAL_BASED_OUTPATIENT_CLINIC_OR_DEPARTMENT_OTHER): Payer: Self-pay | Admitting: Urology

## 2023-05-16 ENCOUNTER — Other Ambulatory Visit: Payer: Self-pay

## 2023-05-16 NOTE — Progress Notes (Signed)
Spoke w/ via phone for pre-op interview---pt Lab needs dos---- I stat, EKG, urine preg              Lab results------hisory of conversion disorder lov neurology dr Gaetana Michaelis 04-10-2023 [facility] COVID test -----patient states asymptomatic no test needed Arrive at -------1015 am 06-05-2023 NPO after MN NO Solid Food.  Clear liquids from MN until---915 am Med rec completed Medications to take morning of surgery ----- flovent, flonase, gabapentin, metoprolol succinate, cyclobenzaprine, baclofen Diabetic medication -----n/a Patient instructed no nail polish to be worn day of surgery Patient instructed to bring photo id and insurance card day of surgery Patient aware to have Driver (ride ) / caregiver  mother peggy atkins  for 24 hours after surgery  Patient Special Instructions -----none Pre-Op special Instructions -----none Patient verbalized understanding of instructions that were given at this phone interview. Patient denies shortness of breath, chest pain, fever, cough at this phone interview.

## 2023-05-29 DIAGNOSIS — I10 Essential (primary) hypertension: Secondary | ICD-10-CM | POA: Diagnosis not present

## 2023-05-29 DIAGNOSIS — R7309 Other abnormal glucose: Secondary | ICD-10-CM | POA: Diagnosis not present

## 2023-06-01 DIAGNOSIS — G8194 Hemiplegia, unspecified affecting left nondominant side: Secondary | ICD-10-CM | POA: Diagnosis not present

## 2023-06-01 DIAGNOSIS — G459 Transient cerebral ischemic attack, unspecified: Secondary | ICD-10-CM | POA: Diagnosis not present

## 2023-06-06 ENCOUNTER — Ambulatory Visit (HOSPITAL_BASED_OUTPATIENT_CLINIC_OR_DEPARTMENT_OTHER)
Admission: RE | Admit: 2023-06-06 | Discharge: 2023-06-06 | Disposition: A | Discharge status: Home / Self Care | Payer: Medicaid Other | Source: Home / Self Care | Attending: Urology | Admitting: Urology

## 2023-06-06 ENCOUNTER — Other Ambulatory Visit: Payer: Self-pay

## 2023-06-06 ENCOUNTER — Ambulatory Visit (HOSPITAL_BASED_OUTPATIENT_CLINIC_OR_DEPARTMENT_OTHER): Payer: Medicaid Other | Admitting: Anesthesiology

## 2023-06-06 ENCOUNTER — Encounter (HOSPITAL_BASED_OUTPATIENT_CLINIC_OR_DEPARTMENT_OTHER)
Admission: RE | Disposition: A | Discharge status: Home / Self Care | Payer: Self-pay | Source: Home / Self Care | Attending: Urology

## 2023-06-06 ENCOUNTER — Encounter (HOSPITAL_BASED_OUTPATIENT_CLINIC_OR_DEPARTMENT_OTHER): Payer: Self-pay | Admitting: Urology

## 2023-06-06 DIAGNOSIS — Z79899 Other long term (current) drug therapy: Secondary | ICD-10-CM | POA: Insufficient documentation

## 2023-06-06 DIAGNOSIS — E669 Obesity, unspecified: Secondary | ICD-10-CM | POA: Diagnosis not present

## 2023-06-06 DIAGNOSIS — I10 Essential (primary) hypertension: Secondary | ICD-10-CM | POA: Insufficient documentation

## 2023-06-06 DIAGNOSIS — N3010 Interstitial cystitis (chronic) without hematuria: Secondary | ICD-10-CM | POA: Diagnosis not present

## 2023-06-06 DIAGNOSIS — Z6837 Body mass index (BMI) 37.0-37.9, adult: Secondary | ICD-10-CM | POA: Diagnosis not present

## 2023-06-06 DIAGNOSIS — Z01818 Encounter for other preprocedural examination: Secondary | ICD-10-CM

## 2023-06-06 DIAGNOSIS — J45909 Unspecified asthma, uncomplicated: Secondary | ICD-10-CM | POA: Insufficient documentation

## 2023-06-06 DIAGNOSIS — F418 Other specified anxiety disorders: Secondary | ICD-10-CM | POA: Insufficient documentation

## 2023-06-06 DIAGNOSIS — Z87891 Personal history of nicotine dependence: Secondary | ICD-10-CM | POA: Diagnosis not present

## 2023-06-06 DIAGNOSIS — G709 Myoneural disorder, unspecified: Secondary | ICD-10-CM | POA: Diagnosis not present

## 2023-06-06 DIAGNOSIS — Z8249 Family history of ischemic heart disease and other diseases of the circulatory system: Secondary | ICD-10-CM | POA: Diagnosis not present

## 2023-06-06 DIAGNOSIS — J4531 Mild persistent asthma with (acute) exacerbation: Secondary | ICD-10-CM

## 2023-06-06 DIAGNOSIS — M797 Fibromyalgia: Secondary | ICD-10-CM | POA: Diagnosis not present

## 2023-06-06 DIAGNOSIS — N3946 Mixed incontinence: Secondary | ICD-10-CM | POA: Insufficient documentation

## 2023-06-06 HISTORY — DX: Essential (primary) hypertension: I10

## 2023-06-06 HISTORY — DX: Insomnia, unspecified: G47.00

## 2023-06-06 HISTORY — DX: Unspecified asthma, uncomplicated: J45.909

## 2023-06-06 HISTORY — DX: Post-traumatic stress disorder, unspecified: F43.10

## 2023-06-06 HISTORY — DX: Plantar fascial fibromatosis: M72.2

## 2023-06-06 HISTORY — DX: Interstitial cystitis (chronic) without hematuria: N30.10

## 2023-06-06 HISTORY — PX: CYSTO WITH HYDRODISTENSION: SHX5453

## 2023-06-06 HISTORY — DX: Migraine, unspecified, not intractable, without status migrainosus: G43.909

## 2023-06-06 HISTORY — DX: Muscle weakness (generalized): M62.81

## 2023-06-06 HISTORY — DX: Presence of spectacles and contact lenses: Z97.3

## 2023-06-06 HISTORY — DX: Dependence on other enabling machines and devices: Z99.89

## 2023-06-06 HISTORY — DX: Chronic pain syndrome: G89.4

## 2023-06-06 HISTORY — DX: Fibromyalgia: M79.7

## 2023-06-06 LAB — POCT I-STAT, CHEM 8
BUN: 13 mg/dL (ref 6–20)
Calcium, Ion: 1.22 mmol/L (ref 1.15–1.40)
Chloride: 102 mmol/L (ref 98–111)
Creatinine, Ser: 0.8 mg/dL (ref 0.44–1.00)
Glucose, Bld: 84 mg/dL (ref 70–99)
HCT: 46 % (ref 36.0–46.0)
Hemoglobin: 15.6 g/dL — ABNORMAL HIGH (ref 12.0–15.0)
Potassium: 3.5 mmol/L (ref 3.5–5.1)
Sodium: 139 mmol/L (ref 135–145)
TCO2: 28 mmol/L (ref 22–32)

## 2023-06-06 LAB — POCT PREGNANCY, URINE: Preg Test, Ur: NEGATIVE

## 2023-06-06 SURGERY — CYSTOSCOPY, WITH BLADDER HYDRODISTENSION
Anesthesia: General | Site: Bladder

## 2023-06-06 MED ORDER — ONDANSETRON HCL 4 MG/2ML IJ SOLN
INTRAMUSCULAR | Status: AC
  Filled 2023-06-06: qty 2

## 2023-06-06 MED ORDER — LACTATED RINGERS IV SOLN: INTRAVENOUS | Status: DC

## 2023-06-06 MED ORDER — FENTANYL CITRATE (PF) 100 MCG/2ML IJ SOLN
25.0000 ug | INTRAMUSCULAR | Status: DC | PRN
  Administered 2023-06-06 (×2): 25 ug via INTRAVENOUS

## 2023-06-06 MED ORDER — STERILE WATER FOR IRRIGATION IR SOLN
Status: DC | PRN
  Administered 2023-06-06: 3000 mL

## 2023-06-06 MED ORDER — HYDRALAZINE HCL 20 MG/ML IJ SOLN
10.0000 mg | Freq: Once | INTRAMUSCULAR | Status: AC
  Administered 2023-06-06: 10 mg via INTRAVENOUS

## 2023-06-06 MED ORDER — HYDRALAZINE HCL 20 MG/ML IJ SOLN
INTRAMUSCULAR | Status: AC
  Filled 2023-06-06: qty 1

## 2023-06-06 MED ORDER — NALOXONE HCL 0.4 MG/ML IJ SOLN
0.4000 mg | INTRAMUSCULAR | Status: DC | PRN
  Administered 2023-06-06 (×3): 0.08 mg via INTRAVENOUS
  Administered 2023-06-06: 0.04 mg via INTRAVENOUS

## 2023-06-06 MED ORDER — MIDAZOLAM HCL 2 MG/2ML IJ SOLN
INTRAMUSCULAR | Status: AC
  Filled 2023-06-06: qty 2

## 2023-06-06 MED ORDER — LABETALOL HCL 5 MG/ML IV SOLN
10.0000 mg | INTRAVENOUS | Status: AC | PRN
  Administered 2023-06-06 (×2): 10 mg via INTRAVENOUS

## 2023-06-06 MED ORDER — DEXAMETHASONE SODIUM PHOSPHATE 10 MG/ML IJ SOLN
INTRAMUSCULAR | Status: AC
  Filled 2023-06-06: qty 1

## 2023-06-06 MED ORDER — PROPOFOL 10 MG/ML IV BOLUS
INTRAVENOUS | Status: DC | PRN
  Administered 2023-06-06: 50 mg via INTRAVENOUS
  Administered 2023-06-06: 200 mg via INTRAVENOUS

## 2023-06-06 MED ORDER — ACETAMINOPHEN 500 MG PO TABS
ORAL_TABLET | ORAL | Status: AC
  Filled 2023-06-06: qty 2

## 2023-06-06 MED ORDER — CIPROFLOXACIN IN D5W 400 MG/200ML IV SOLN
400.0000 mg | INTRAVENOUS | Status: AC
  Administered 2023-06-06: 400 mg via INTRAVENOUS

## 2023-06-06 MED ORDER — PHENAZOPYRIDINE HCL 200 MG PO TABS
ORAL | Status: DC | PRN
  Administered 2023-06-06: 15 mL via INTRAVESICAL

## 2023-06-06 MED ORDER — LIDOCAINE HCL (PF) 2 % IJ SOLN
INTRAMUSCULAR | Status: AC
  Filled 2023-06-06: qty 5

## 2023-06-06 MED ORDER — AMISULPRIDE (ANTIEMETIC) 5 MG/2ML IV SOLN: 10.0000 mg | Freq: Once | INTRAVENOUS | Status: DC | PRN

## 2023-06-06 MED ORDER — LABETALOL HCL 5 MG/ML IV SOLN
INTRAVENOUS | Status: AC
  Filled 2023-06-06: qty 4

## 2023-06-06 MED ORDER — ONDANSETRON HCL 4 MG/2ML IJ SOLN: 4.0000 mg | Freq: Once | INTRAMUSCULAR | Status: DC | PRN

## 2023-06-06 MED ORDER — FENTANYL CITRATE (PF) 100 MCG/2ML IJ SOLN
INTRAMUSCULAR | Status: AC
  Filled 2023-06-06: qty 2

## 2023-06-06 MED ORDER — MIDAZOLAM HCL 5 MG/5ML IJ SOLN
INTRAMUSCULAR | Status: DC | PRN
  Administered 2023-06-06: 2 mg via INTRAVENOUS

## 2023-06-06 MED ORDER — FENTANYL CITRATE (PF) 100 MCG/2ML IJ SOLN
INTRAMUSCULAR | Status: DC | PRN
  Administered 2023-06-06 (×2): 50 ug via INTRAVENOUS

## 2023-06-06 MED ORDER — DEXAMETHASONE SODIUM PHOSPHATE 4 MG/ML IJ SOLN
INTRAMUSCULAR | Status: DC | PRN
  Administered 2023-06-06: 5 mg via INTRAVENOUS

## 2023-06-06 MED ORDER — CIPROFLOXACIN IN D5W 400 MG/200ML IV SOLN
INTRAVENOUS | Status: AC
  Filled 2023-06-06: qty 200

## 2023-06-06 MED ORDER — ONDANSETRON HCL 4 MG/2ML IJ SOLN
INTRAMUSCULAR | Status: DC | PRN
Start: 2023-06-06 — End: 2023-06-06
  Administered 2023-06-06: 4 mg via INTRAVENOUS

## 2023-06-06 MED ORDER — ACETAMINOPHEN 500 MG PO TABS
1000.0000 mg | ORAL_TABLET | Freq: Once | ORAL | Status: AC
  Administered 2023-06-06: 1000 mg via ORAL

## 2023-06-06 MED ORDER — LIDOCAINE 2% (20 MG/ML) 5 ML SYRINGE
INTRAMUSCULAR | Status: DC | PRN
  Administered 2023-06-06: 60 mg via INTRAVENOUS

## 2023-06-06 SURGICAL SUPPLY — 17 items
BAG DRAIN URO-CYSTO SKYTR STRL (DRAIN) ×1 IMPLANT
BAG DRN UROCATH (DRAIN) ×1
CATH ROBINSON RED A/P 14FR (CATHETERS) IMPLANT
CLOTH BEACON ORANGE TIMEOUT ST (SAFETY) ×1 IMPLANT
GLOVE BIO SURGEON STRL SZ7.5 (GLOVE) ×1 IMPLANT
GLOVE BIOGEL PI IND STRL 6.5 (GLOVE) IMPLANT
GLOVE SURG SS PI 6.5 STRL IVOR (GLOVE) IMPLANT
GOWN STRL REUS W/ TWL LRG LVL3 (GOWN DISPOSABLE) IMPLANT
GOWN STRL REUS W/TWL LRG LVL3 (GOWN DISPOSABLE) ×2 IMPLANT
KIT TURNOVER CYSTO (KITS) ×1 IMPLANT
MANIFOLD NEPTUNE II (INSTRUMENTS) ×1 IMPLANT
NDL SAFETY ECLIP 18X1.5 (MISCELLANEOUS) ×1 IMPLANT
PACK CYSTO (CUSTOM PROCEDURE TRAY) ×1 IMPLANT
SLEEVE SCD COMPRESS KNEE MED (STOCKING) ×1 IMPLANT
SYR 20ML LL LF (SYRINGE) ×1 IMPLANT
TUBE CONNECTING 12X1/4 (SUCTIONS) IMPLANT
WATER STERILE IRR 3000ML UROMA (IV SOLUTION) ×1 IMPLANT

## 2023-06-06 NOTE — Progress Notes (Signed)
Patient unable to void, bladder scan x 2 complete at this time. 791 ml and >999 ml results.

## 2023-06-06 NOTE — Interval H&P Note (Signed)
History and Physical Interval Note:  06/06/2023 11:43 AM  Grace Sheppard  has presented today for surgery, with the diagnosis of INTERSITIAL CYSTITIS.  The various methods of treatment have been discussed with the patient and family. After consideration of risks, benefits and other options for treatment, the patient has consented to  Procedure(s) with comments: CYSTOSCOPY/HYDRODISTENSION INSTILL MARCAINE AND PYRIDIUM (N/A) - 30 MINS FOR CASE as a surgical intervention.  The patient's history has been reviewed, patient examined, no change in status, stable for surgery.  I have reviewed the patient's chart and labs.  Questions were answered to the patient's satisfaction.      A 

## 2023-06-06 NOTE — Progress Notes (Signed)
Patient arrived to PACU on simple mask sleeping. RN and CRNA attempted to wake patient without success. Patient ventilating and vital signs were stable. Another attempt made to arouse patient without success. Dr. Desmond Lope notified and stated to let patient sleep and try to awake in 10 minutes. If no success, narcan can be given and repeated every 3 minutes. Patient attempted to be aroused intermittently in 10 minute time frame without success. Entidal CO2 started and Narcan started at 1324. Patient aroused 1340. Patient awakened emotional and scared. Patient consoled and due to elevated blood pressures, blood pressure treated with Labetalol. Labetalol repeated due to blood pressure not responding to first dose. Dr. Desmond Lope notified after 2nd dose with decrease in heart rate and blood pressure. Dr. Desmond Lope stated that patient was okay to be discharged. Patient stated that she will take usual bedtime blood pressure medication before she goes to bed.

## 2023-06-06 NOTE — Anesthesia Preprocedure Evaluation (Addendum)
Anesthesia Evaluation  Patient identified by MRN, date of birth, ID band Patient awake    Reviewed: Allergy & Precautions, NPO status , Patient's Chart, lab work & pertinent test results, reviewed documented beta blocker date and time   Airway Mallampati: III  TM Distance: >3 FB Neck ROM: Full    Dental  (+) Teeth Intact, Dental Advisory Given, Chipped,    Pulmonary asthma , former smoker   Pulmonary exam normal breath sounds clear to auscultation       Cardiovascular hypertension, Pt. on medications and Pt. on home beta blockers Normal cardiovascular exam Rhythm:Regular Rate:Normal     Neuro/Psych  Headaches PSYCHIATRIC DISORDERS Anxiety Depression    Pseudoseizures   Neuromuscular disease    GI/Hepatic negative GI ROS, Neg liver ROS,,,  Endo/Other  Obesity   Renal/GU negative Renal ROS    INTERSITIAL CYSTITIS    Musculoskeletal  (+)  Fibromyalgia -  Abdominal   Peds  Hematology negative hematology ROS (+)   Anesthesia Other Findings Day of surgery medications reviewed with the patient.  Reproductive/Obstetrics                             Anesthesia Physical Anesthesia Plan  ASA: 3  Anesthesia Plan: General   Post-op Pain Management: Tylenol PO (pre-op)*   Induction: Intravenous  PONV Risk Score and Plan: 4 or greater and Midazolam, Dexamethasone and Ondansetron  Airway Management Planned: LMA  Additional Equipment:   Intra-op Plan:   Post-operative Plan: Extubation in OR  Informed Consent: I have reviewed the patients History and Physical, chart, labs and discussed the procedure including the risks, benefits and alternatives for the proposed anesthesia with the patient or authorized representative who has indicated his/her understanding and acceptance.     Dental advisory given  Plan Discussed with: CRNA  Anesthesia Plan Comments:         Anesthesia Quick  Evaluation

## 2023-06-06 NOTE — Progress Notes (Signed)
Paged Dr. McDiarmid at this time.

## 2023-06-06 NOTE — Discharge Instructions (Addendum)
No acetaminophen/Tylenol until after 4:30pm today if needed for pain.      Post Anesthesia Home Care Instructions  Activity: Get plenty of rest for the remainder of the day. A responsible individual must stay with you for 24 hours following the procedure.  For the next 24 hours, DO NOT: -Drive a car -Advertising copywriter -Drink alcoholic beverages -Take any medication unless instructed by your physician -Make any legal decisions or sign important papers.  Meals: Start with liquid foods such as gelatin or soup. Progress to regular foods as tolerated. Avoid greasy, spicy, heavy foods. If nausea and/or vomiting occur, drink only clear liquids until the nausea and/or vomiting subsides. Call your physician if vomiting continues.  Special Instructions/Symptoms: Your throat may feel dry or sore from the anesthesia or the breathing tube placed in your throat during surgery. If this causes discomfort, gargle with warm salt water. The discomfort should disappear within 24 hours.  No acetaminophen/Tylenol until after 4:45 pm today if needed.     I have reviewed discharge instructions in detail with the patient. They will follow-up with me or their physician as scheduled. My nurse will also be calling the patients as per protocol.   I sent antibiotics and pain medication to pharmacy

## 2023-06-06 NOTE — Transfer of Care (Addendum)
Immediate Anesthesia Transfer of Care Note  Patient: Grace Sheppard  Procedure(s) Performed: Procedure(s) (LRB): CYSTOSCOPY/HYDRODISTENSION INSTILL MARCAINE AND PYRIDIUM (N/A)  Patient Location: PACU  Anesthesia Type: General  Level of Consciousness: awake, sedated, patient cooperative and responds to stimulation  Airway & Oxygen Therapy: Patient Spontanous Breathing and Patient connected to [place] oxygen  Post-op Assessment: Report given to PACU RN, Post -op Vital signs reviewed and stable , however sleepy not responding to name called or awake.    Post vital signs: Reviewed and stable  Complications: No apparent anesthesia complications

## 2023-06-06 NOTE — Progress Notes (Signed)
Notified Dr. McDiarmid of bladder scan results. Order given to insert 14 fr foley catheter and let patient know the office will call in the morning to arrange a time for an office visit tomorrow morning.

## 2023-06-06 NOTE — Anesthesia Procedure Notes (Signed)
Procedure Name: LMA Insertion Date/Time: 06/06/2023 12:10 PM  Performed by: Jessica Priest, CRNAPre-anesthesia Checklist: Patient identified, Emergency Drugs available, Suction available, Patient being monitored and Timeout performed Patient Re-evaluated:Patient Re-evaluated prior to induction Oxygen Delivery Method: Circle system utilized Preoxygenation: Pre-oxygenation with 100% oxygen Induction Type: IV induction Ventilation: Mask ventilation without difficulty LMA: LMA inserted LMA Size: 4.0 Number of attempts: 1 Airway Equipment and Method: Bite block Placement Confirmation: positive ETCO2, breath sounds checked- equal and bilateral and CO2 detector Tube secured with: Tape Dental Injury: Teeth and Oropharynx as per pre-operative assessment

## 2023-06-06 NOTE — Op Note (Signed)
Preoperative diagnosis: Chronic cystitis Postoperative diagnosis: Chronic interstitial cystitis Procedure: Cystoscopy bladder hydrodistention and bladder instillation therapy Surgeon: Dr. Lorin Picket   The patient was prepped and draped in usual fashion.  Extra care was taken to leg positioning.  Preoperative antibiotics were given.  24 French cystoscope was utilized.  Bladder mucosa and trigone were normal.  She was hydrodistended 800 mL.  Bladder was emptied.  On reinspection she had diffuse glomerulations in the lower third of the bladder primarily around the trigone.  No ulcers.  No bleeding.  No bladder injury.  Bladder was emptied.  With a red rubber catheter I instilled 15 cc of 0.5% Marcaine +400 mg of Pyridium  The patient will be treated for interstitial cystitis

## 2023-06-06 NOTE — H&P (Signed)
reviewed my last note in detail  Grace Sheppard has had less leaking in terms of volume and milder bedwetting on Myrbetriq. She is seeing physical therapy of the pelvic floor off-site. Her other symptoms are improving slowly and she is seeing psychology and I think psychiatry   Reassess in 5 weeks on Gemtesa samples   Today  Grace Sheppard has less urge incontinence and frequency and a lot less leaking. Clinically not infected. She sometimes feels some fullness in the vagina. She sees physical therapy at [facility]. They might have someone fit her with a pessary. I am not convinced this will help but in the short-term and being reversible it is fine to try. She understands that she should not be considering surgical options at this stage. She continues to see psychology or psychiatry and is doing well. We talked about her positive numbness in her granddad. I will reassess her in 4 months. Gemtesa samples and prescription sent. Call if urine culture positive   Today  Frequency stable. Last culture negative  Grace Sheppard still has a little bit of incontinence and comes and goes. She can get right lower quadrant discomfort that comes and goes but almost every day. Physical therapy she said helps both a lot. She has posttraumatic stress disorder and she says she is improving on new medication or treatment from her neurologist.   I talked her about a hydrodistention with full template. Were not can proceed yet but we could in the future. She may have interstitial cystitis. I do not think she is ever had a CT scan but I will discuss this with her next time. She still might see a gynecologist for possible pessary. We decided to continue with physical therapy and Gemtesa and I will recheck her in 6 months. Clinically not infected today   Today  Over the weekend the Grace Sheppard started having suprapubic pain. She had a feeling of incomplete emptying. She did have to push. Urine today looked normal but I sent for culture   Recognizing limitations I thought I would call her in ciprofloxacin 250 mg twice a day for 7 days. I think we should order CT scan for abdominal pain and see nurse practitioner in the next 2 weeks. If she still having pain and the culture is negative I think it is time to move ahead with a hydrodistention. This was discussed.       ALLERGIES: Amoxicillin - Skin Rash Keflex - Skin Rash Penicillin - Skin Rash    MEDICATIONS: Gemtesa 75 mg tablet 1 tablet PO Daily  Gemtesa 75 mg tablet  Lisinopril 20 mg tablet  Trimethoprim 100 mg tablet 1 tablet PO Daily  Adipex-P 37.5 mg tablet  Amitriptyline Hcl 10 mg tablet  Cyclobenzaprine Hcl 10 mg tablet  Diclofenac Sodium 1 % gel  Dicyclomine Hcl 20 mg tablet  Fioricet  Flonase Allergy Relief  Flovent Hfa 44 mcg aerosol with adapter  Gabapentin 300 mg capsule  Maxalt 10 mg tablet  Meloxicam 15 mg tablet  Miralax  Ondansetron Odt 4 mg tablet,disintegrating  Proair  Selenium 200 mcg capsule  Trazodone Hcl 50 mg tablet  Wellbutrin Xl 150 mg tablet, extended release 24 hr     GU PSH: Complex cystometrogram, w/ void pressure and urethral pressure profile studies, any technique - 05/12/2022 Complex Uroflow - 05/12/2022 Emg surf Electrd - 05/12/2022 Inject For cystogram - 05/12/2022 Intrabd voidng Press - 05/12/2022       PSH Notes: rt great toenail removed   NON-GU PSH: Bilateral  Tubal Ligation Cholecystectomy (laparoscopic)     GU PMH: Mixed incontinence - 02/01/2023, - 08/25/2022, - 07/07/2022, - 06/02/2022, - 05/12/2022, - 04/08/2022, - 01/25/2022, - 12/02/2021 Urinary Tract Inf, Unspec site - 02/01/2023, - 12/02/2021 Urinary Frequency - 08/25/2022, - 07/07/2022, - 06/02/2022, - 04/08/2022, - 01/25/2022, - 12/02/2021 Urinary Retention - 03/04/2022 Nocturia - 12/02/2021      PMH Notes: was told she might have bladder cancer when younger   NON-GU PMH: Acute gastric ulcer with hemorrhage Anxiety Asthma Depression Fibromyalgia Rheumatoid  Arthritis Seizure disorder    FAMILY HISTORY: Diabetes - Mother, Father Hypercholesterolemia - Mother, Runs in Family, Father Hypertension - Father, Mother Kidney Stones - Father prostate cancer in father - Father   SOCIAL HISTORY: Marital Status: Unknown Preferred Language: English; Race: White Current Smoking Status: Grace Sheppard has never smoked.   Tobacco Use Assessment Completed: Used Tobacco in last 30 days? Has never drank.  Drinks 2 caffeinated drinks per day. Grace Sheppard's occupation is/was [employer].     Notes: 2 biological sons and 2 sons by marriage   REVIEW OF SYSTEMS:    GU Review Female:   Grace Sheppard denies frequent urination, hard to postpone urination, burning /pain with urination, get up at night to urinate, leakage of urine, stream starts and stops, trouble starting your stream, have to strain to urinate, and being pregnant.  Gastrointestinal (Upper):   Grace Sheppard denies nausea, vomiting, and indigestion/ heartburn.  Gastrointestinal (Lower):   Grace Sheppard denies diarrhea and constipation.  Constitutional:   Grace Sheppard denies fever, night sweats, weight loss, and fatigue.  Skin:   Grace Sheppard denies skin rash/ lesion and itching.  Eyes:   Grace Sheppard denies blurred vision and double vision.  Ears/ Nose/ Throat:   Grace Sheppard denies sore throat and sinus problems.  Hematologic/Lymphatic:   Grace Sheppard denies swollen glands and easy bruising.  Cardiovascular:   Grace Sheppard denies leg swelling and chest pains.  Respiratory:   Grace Sheppard denies cough and shortness of breath.  Endocrine:   Grace Sheppard denies excessive thirst.  Musculoskeletal:   Grace Sheppard denies back pain and joint pain.  Neurological:   Grace Sheppard denies headaches and dizziness.  Psychologic:   Grace Sheppard denies depression and anxiety.   VITAL SIGNS: None   PAST DATA REVIEW: None   PROCEDURES:          Urinalysis - 81003 Dipstick Dipstick Cont'd  Color: Yellow Bilirubin: Neg  Appearance: Clear Ketones: Neg  Specific Gravity: 1.010 Blood: Neg  pH:  6.0 Protein: Trace  Glucose: Neg Urobilinogen: 0.2    Nitrites: Neg    Leukocyte Esterase: Neg    ASSESSMENT:      ICD-10 Details  1 GU:   Mixed incontinence - N39.46   2   Urinary Tract Inf, Unspec site - N39.0   3   RLQ pain - R10.31      PLAN:            Medications New Meds: Cipro 250 mg tablet 1 tablet PO BID   #14  0 Refill(s)  Pharmacy Name:  [facility]  Address:  [address], [place] [address]  Phone:  [phone]  Fax:  [phone]            Orders Labs BUN, Creatinine and GFR, Urine Culture          Schedule X-Rays: 2 Weeks - C.T. Abdomen/Pelvis With and Without I.V. Contrast  Return Visit/Planned Activity: 2 Weeks - Extender, C.T. Abdomen/Pelvis  Note: jennaya          Document Letter(s):  Created for Grace Sheppard: Clinical Summary         Next Appointment:      Next Appointment: 03/16/2023 10:00 AM    Appointment Type: Office Visit Established Grace Sheppard

## 2023-06-06 NOTE — Anesthesia Postprocedure Evaluation (Signed)
Anesthesia Post Note  Patient: Grace Sheppard  Procedure(s) Performed: CYSTOSCOPY/HYDRODISTENSION INSTILL MARCAINE AND PYRIDIUM (Bladder)     Patient location during evaluation: PACU Anesthesia Type: General Level of consciousness: awake and alert Pain management: pain level controlled Vital Signs Assessment: post-procedure vital signs reviewed and stable Respiratory status: spontaneous breathing, nonlabored ventilation, respiratory function stable and patient connected to nasal cannula oxygen Cardiovascular status: blood pressure returned to baseline and stable Postop Assessment: no apparent nausea or vomiting Anesthetic complications: no Comments: Required narcan boluses in PACU for non-responsiveness. Following narcan, patient awake, alert, and following commands.   No notable events documented.  Last Vitals:  Vitals:   06/06/23 1327 06/06/23 1330  BP: (!) 174/116 (!) 178/110  Pulse: 98 99  Resp: (!) 24 (!) 21  Temp:  (!) 36.3 C  SpO2: 93% 93%    Last Pain:  Vitals:   06/06/23 1345  TempSrc:   PainSc: 0-No pain                 Collene Schlichter

## 2023-06-07 ENCOUNTER — Encounter (HOSPITAL_BASED_OUTPATIENT_CLINIC_OR_DEPARTMENT_OTHER): Payer: Self-pay | Admitting: Urology

## 2023-06-07 DIAGNOSIS — R338 Other retention of urine: Secondary | ICD-10-CM | POA: Diagnosis not present

## 2023-06-07 DIAGNOSIS — R350 Frequency of micturition: Secondary | ICD-10-CM | POA: Diagnosis not present

## 2023-06-14 DIAGNOSIS — N3946 Mixed incontinence: Secondary | ICD-10-CM | POA: Diagnosis not present

## 2023-06-14 DIAGNOSIS — N3010 Interstitial cystitis (chronic) without hematuria: Secondary | ICD-10-CM | POA: Diagnosis not present

## 2023-06-20 DIAGNOSIS — N3010 Interstitial cystitis (chronic) without hematuria: Secondary | ICD-10-CM | POA: Diagnosis not present

## 2023-06-22 DIAGNOSIS — N3010 Interstitial cystitis (chronic) without hematuria: Secondary | ICD-10-CM | POA: Diagnosis not present

## 2023-06-26 DIAGNOSIS — N3010 Interstitial cystitis (chronic) without hematuria: Secondary | ICD-10-CM | POA: Diagnosis not present

## 2023-06-29 DIAGNOSIS — K047 Periapical abscess without sinus: Secondary | ICD-10-CM | POA: Diagnosis not present

## 2023-06-29 DIAGNOSIS — R112 Nausea with vomiting, unspecified: Secondary | ICD-10-CM | POA: Diagnosis not present

## 2023-07-02 DIAGNOSIS — G459 Transient cerebral ischemic attack, unspecified: Secondary | ICD-10-CM | POA: Diagnosis not present

## 2023-07-02 DIAGNOSIS — G8194 Hemiplegia, unspecified affecting left nondominant side: Secondary | ICD-10-CM | POA: Diagnosis not present

## 2023-07-10 DIAGNOSIS — F4312 Post-traumatic stress disorder, chronic: Secondary | ICD-10-CM | POA: Diagnosis not present

## 2023-07-10 DIAGNOSIS — F32A Depression, unspecified: Secondary | ICD-10-CM | POA: Diagnosis not present

## 2023-07-10 DIAGNOSIS — F411 Generalized anxiety disorder: Secondary | ICD-10-CM | POA: Diagnosis not present

## 2023-07-11 DIAGNOSIS — E538 Deficiency of other specified B group vitamins: Secondary | ICD-10-CM | POA: Diagnosis not present

## 2023-07-11 DIAGNOSIS — N3010 Interstitial cystitis (chronic) without hematuria: Secondary | ICD-10-CM | POA: Diagnosis not present

## 2023-07-11 DIAGNOSIS — R350 Frequency of micturition: Secondary | ICD-10-CM | POA: Diagnosis not present

## 2023-07-11 DIAGNOSIS — G8190 Hemiplegia, unspecified affecting unspecified side: Secondary | ICD-10-CM | POA: Diagnosis not present

## 2023-07-13 DIAGNOSIS — N3010 Interstitial cystitis (chronic) without hematuria: Secondary | ICD-10-CM | POA: Diagnosis not present

## 2023-07-19 DIAGNOSIS — F4310 Post-traumatic stress disorder, unspecified: Secondary | ICD-10-CM | POA: Diagnosis not present

## 2023-07-19 DIAGNOSIS — N3010 Interstitial cystitis (chronic) without hematuria: Secondary | ICD-10-CM | POA: Diagnosis not present

## 2023-07-24 DIAGNOSIS — N3010 Interstitial cystitis (chronic) without hematuria: Secondary | ICD-10-CM | POA: Diagnosis not present

## 2023-08-01 DIAGNOSIS — G459 Transient cerebral ischemic attack, unspecified: Secondary | ICD-10-CM | POA: Diagnosis not present

## 2023-08-01 DIAGNOSIS — G8194 Hemiplegia, unspecified affecting left nondominant side: Secondary | ICD-10-CM | POA: Diagnosis not present

## 2023-08-08 DIAGNOSIS — N3010 Interstitial cystitis (chronic) without hematuria: Secondary | ICD-10-CM | POA: Diagnosis not present

## 2023-08-08 DIAGNOSIS — F4310 Post-traumatic stress disorder, unspecified: Secondary | ICD-10-CM | POA: Diagnosis not present

## 2023-08-11 DIAGNOSIS — I1A0 Resistant hypertension: Secondary | ICD-10-CM | POA: Diagnosis not present

## 2023-08-29 DIAGNOSIS — F4310 Post-traumatic stress disorder, unspecified: Secondary | ICD-10-CM | POA: Diagnosis not present

## 2023-09-01 DIAGNOSIS — G459 Transient cerebral ischemic attack, unspecified: Secondary | ICD-10-CM | POA: Diagnosis not present

## 2023-09-01 DIAGNOSIS — G8194 Hemiplegia, unspecified affecting left nondominant side: Secondary | ICD-10-CM | POA: Diagnosis not present

## 2023-09-14 DIAGNOSIS — S83282A Other tear of lateral meniscus, current injury, left knee, initial encounter: Secondary | ICD-10-CM | POA: Diagnosis not present

## 2023-09-14 DIAGNOSIS — M25462 Effusion, left knee: Secondary | ICD-10-CM | POA: Diagnosis not present

## 2023-09-14 DIAGNOSIS — M25562 Pain in left knee: Secondary | ICD-10-CM | POA: Diagnosis not present

## 2023-09-25 DIAGNOSIS — S83282A Other tear of lateral meniscus, current injury, left knee, initial encounter: Secondary | ICD-10-CM | POA: Diagnosis not present

## 2023-09-25 DIAGNOSIS — M546 Pain in thoracic spine: Secondary | ICD-10-CM | POA: Diagnosis not present

## 2023-09-25 DIAGNOSIS — M25462 Effusion, left knee: Secondary | ICD-10-CM | POA: Diagnosis not present

## 2023-09-25 DIAGNOSIS — M25562 Pain in left knee: Secondary | ICD-10-CM | POA: Diagnosis not present

## 2023-09-25 DIAGNOSIS — E66812 Obesity, class 2: Secondary | ICD-10-CM | POA: Diagnosis not present

## 2023-09-25 DIAGNOSIS — M722 Plantar fascial fibromatosis: Secondary | ICD-10-CM | POA: Diagnosis not present

## 2023-09-25 DIAGNOSIS — M222X2 Patellofemoral disorders, left knee: Secondary | ICD-10-CM | POA: Diagnosis not present

## 2023-09-25 DIAGNOSIS — G8929 Other chronic pain: Secondary | ICD-10-CM | POA: Diagnosis not present

## 2023-09-25 DIAGNOSIS — R7303 Prediabetes: Secondary | ICD-10-CM | POA: Diagnosis not present

## 2023-09-25 DIAGNOSIS — L309 Dermatitis, unspecified: Secondary | ICD-10-CM | POA: Diagnosis not present

## 2023-09-25 DIAGNOSIS — I1A0 Resistant hypertension: Secondary | ICD-10-CM | POA: Diagnosis not present

## 2023-09-25 DIAGNOSIS — Z6837 Body mass index (BMI) 37.0-37.9, adult: Secondary | ICD-10-CM | POA: Diagnosis not present

## 2023-10-01 DIAGNOSIS — G459 Transient cerebral ischemic attack, unspecified: Secondary | ICD-10-CM | POA: Diagnosis not present

## 2023-10-01 DIAGNOSIS — G8194 Hemiplegia, unspecified affecting left nondominant side: Secondary | ICD-10-CM | POA: Diagnosis not present

## 2023-10-10 DIAGNOSIS — I10 Essential (primary) hypertension: Secondary | ICD-10-CM | POA: Diagnosis not present

## 2023-10-10 DIAGNOSIS — I159 Secondary hypertension, unspecified: Secondary | ICD-10-CM | POA: Diagnosis not present

## 2023-10-12 ENCOUNTER — Ambulatory Visit: Payer: Medicaid Other | Admitting: Podiatry

## 2023-10-16 DIAGNOSIS — F4312 Post-traumatic stress disorder, chronic: Secondary | ICD-10-CM | POA: Diagnosis not present

## 2023-10-16 DIAGNOSIS — F32A Depression, unspecified: Secondary | ICD-10-CM | POA: Diagnosis not present

## 2023-10-16 DIAGNOSIS — F411 Generalized anxiety disorder: Secondary | ICD-10-CM | POA: Diagnosis not present

## 2023-10-23 ENCOUNTER — Ambulatory Visit (INDEPENDENT_AMBULATORY_CARE_PROVIDER_SITE_OTHER): Payer: Medicaid Other

## 2023-10-23 ENCOUNTER — Encounter: Payer: Self-pay | Admitting: Podiatry

## 2023-10-23 ENCOUNTER — Ambulatory Visit: Payer: Medicaid Other | Admitting: Podiatry

## 2023-10-23 DIAGNOSIS — M21372 Foot drop, left foot: Secondary | ICD-10-CM | POA: Diagnosis not present

## 2023-10-23 DIAGNOSIS — M722 Plantar fascial fibromatosis: Secondary | ICD-10-CM

## 2023-10-23 DIAGNOSIS — M7751 Other enthesopathy of right foot: Secondary | ICD-10-CM | POA: Diagnosis not present

## 2023-10-23 DIAGNOSIS — M778 Other enthesopathies, not elsewhere classified: Secondary | ICD-10-CM

## 2023-10-23 DIAGNOSIS — M7752 Other enthesopathy of left foot: Secondary | ICD-10-CM

## 2023-10-23 MED ORDER — BETAMETHASONE SOD PHOS & ACET 6 (3-3) MG/ML IJ SUSP
3.0000 mg | Freq: Once | INTRAMUSCULAR | Status: AC
Start: 2023-10-23 — End: 2023-10-23
  Administered 2023-10-23: 3 mg via INTRA_ARTICULAR

## 2023-10-23 NOTE — Progress Notes (Addendum)
   Chief Complaint  Patient presents with   Foot Pain    Subjective: 27 y.o. female presenting for new onset of heel pain bilaterally.  Last seen in the office 01/18/2021.  She believes that she has a recurrence of her plantar fasciitis.  Since last visit she has developed left-sided weakness possibly secondary to PTSD.  Onset March 2023.   Past Medical History:  Diagnosis Date   Anxiety    Asthma    Chronic pain syndrome    Conversion disorder    Does mobilize using cane    Fibromyalgia    Hypertension    Insomnia    Interstitial cystitis    Migraine    Muscle weakness of left arm    wears brace, from conversion disorder   Neuropathy    toes and left hand   Plantar fasciitis    both feet   Post partum depression    Prediabetes 2024   Pseudoseizures 2014   2014 none since   PTSD (post-traumatic stress disorder)    Syncope    recurrent   Wears glasses      Objective: Physical Exam General: The patient is alert and oriented x3 in no acute distress.  Dermatology: Skin is warm, dry and supple bilateral lower extremities. Negative for open lesions or macerations bilateral.   Vascular: Dorsalis Pedis and Posterior Tibial pulses palpable bilateral.  Capillary fill time is immediate to all digits.  Neurological: Grossly intact via light touch  Musculoskeletal: Tenderness to palpation to the plantar aspect of the bilateral heels along the plantar fascia. All other joints range of motion within normal limits bilateral.  Foot deformity noted left lower extremity secondary to medical episode March 2023  Assessment: 1. plantar fasciitis bilateral feet; improved 2.  Dropfoot left lower extremity  Plan of Care:  -Patient evaluated. Xrays reviewed.   -Injection of 0.5cc Celestone soluspan injected into the bilateral heels.  -OTC power step insoles were dispensed.  Wear daily -Patient is already on anti-inflammatory meloxicam medication.  Continue as prescribed -Continue AFO  bracing for left lower extremity -Advise against when barefoot.  Recommend good supportive shoes at all times -Return to clinic as needed   Felecia Shelling, DPM [facility]  Dr. Felecia Shelling, DPM    [address] [place], [place] [address]                Office [phone]  Fax [phone]

## 2023-11-14 DIAGNOSIS — I10 Essential (primary) hypertension: Secondary | ICD-10-CM | POA: Diagnosis not present

## 2023-11-17 DIAGNOSIS — J069 Acute upper respiratory infection, unspecified: Secondary | ICD-10-CM | POA: Diagnosis not present

## 2023-11-17 DIAGNOSIS — R0789 Other chest pain: Secondary | ICD-10-CM | POA: Diagnosis not present

## 2023-11-17 DIAGNOSIS — J029 Acute pharyngitis, unspecified: Secondary | ICD-10-CM | POA: Diagnosis not present

## 2023-11-20 DIAGNOSIS — R9431 Abnormal electrocardiogram [ECG] [EKG]: Secondary | ICD-10-CM | POA: Diagnosis not present

## 2023-11-24 DIAGNOSIS — M7918 Myalgia, other site: Secondary | ICD-10-CM | POA: Diagnosis not present

## 2023-11-24 DIAGNOSIS — G8929 Other chronic pain: Secondary | ICD-10-CM | POA: Diagnosis not present

## 2023-12-08 DIAGNOSIS — I159 Secondary hypertension, unspecified: Secondary | ICD-10-CM | POA: Diagnosis not present

## 2023-12-08 DIAGNOSIS — I10 Essential (primary) hypertension: Secondary | ICD-10-CM | POA: Diagnosis not present

## 2023-12-11 DIAGNOSIS — F32A Depression, unspecified: Secondary | ICD-10-CM | POA: Diagnosis not present

## 2023-12-11 DIAGNOSIS — F411 Generalized anxiety disorder: Secondary | ICD-10-CM | POA: Diagnosis not present

## 2023-12-11 DIAGNOSIS — F4312 Post-traumatic stress disorder, chronic: Secondary | ICD-10-CM | POA: Diagnosis not present

## 2023-12-11 DIAGNOSIS — F445 Conversion disorder with seizures or convulsions: Secondary | ICD-10-CM | POA: Diagnosis not present

## 2023-12-27 DIAGNOSIS — M7918 Myalgia, other site: Secondary | ICD-10-CM | POA: Diagnosis not present

## 2024-01-04 DIAGNOSIS — R296 Repeated falls: Secondary | ICD-10-CM | POA: Diagnosis not present

## 2024-01-04 DIAGNOSIS — Z6835 Body mass index (BMI) 35.0-35.9, adult: Secondary | ICD-10-CM | POA: Diagnosis not present

## 2024-01-04 DIAGNOSIS — I10 Essential (primary) hypertension: Secondary | ICD-10-CM | POA: Diagnosis not present

## 2024-01-04 DIAGNOSIS — E66812 Obesity, class 2: Secondary | ICD-10-CM | POA: Diagnosis not present

## 2024-01-11 ENCOUNTER — Encounter: Payer: Self-pay | Admitting: "Endocrinology

## 2024-01-11 ENCOUNTER — Ambulatory Visit (INDEPENDENT_AMBULATORY_CARE_PROVIDER_SITE_OTHER): Payer: Medicaid Other | Admitting: "Endocrinology

## 2024-01-11 VITALS — BP 162/80 | HR 76 | Ht 64.0 in | Wt 195.8 lb

## 2024-01-11 DIAGNOSIS — R7303 Prediabetes: Secondary | ICD-10-CM | POA: Diagnosis not present

## 2024-01-11 DIAGNOSIS — I1A0 Resistant hypertension: Secondary | ICD-10-CM

## 2024-01-11 DIAGNOSIS — E669 Obesity, unspecified: Secondary | ICD-10-CM | POA: Diagnosis not present

## 2024-01-11 DIAGNOSIS — R739 Hyperglycemia, unspecified: Secondary | ICD-10-CM

## 2024-01-11 LAB — POCT GLYCOSYLATED HEMOGLOBIN (HGB A1C): HbA1c, POC (prediabetic range): 5.3 % — AB (ref 5.7–6.4)

## 2024-01-11 NOTE — Patient Instructions (Signed)

## 2024-01-11 NOTE — Progress Notes (Signed)
 Endocrinology Consult Note                                            01/11/2024, 12:45 PM   Subjective:    Patient ID: Grace Sheppard, female    DOB: 06/22/1996, PCP Trisha Mangle, FNP   Past Medical History:  Diagnosis Date   Anxiety    Asthma    Chronic pain syndrome    Conversion disorder    Does mobilize using cane    Fibromyalgia    Hypertension    Insomnia    Interstitial cystitis    Migraine    Muscle weakness of left arm    wears brace, from conversion disorder   Neuropathy    toes and left hand   Plantar fasciitis    both feet   Post partum depression    Prediabetes 2024   Pseudoseizures 2014   2014 none since   PTSD (post-traumatic stress disorder)    Syncope    recurrent   Wears glasses    Past Surgical History:  Procedure Laterality Date   CHOLECYSTECTOMY     yrs ago   CYSTO WITH HYDRODISTENSION N/A 06/06/2023   Procedure: CYSTOSCOPY/HYDRODISTENSION INSTILL MARCAINE AND PYRIDIUM;  Surgeon: Alfredo Martinez, MD;  Location: [facility] [place];  Service: Urology;  Laterality: N/A;   TUBAL LIGATION     yrs ago   Social History   Socioeconomic History   Marital status: Legally Separated    Spouse name: Not on file   Number of children: Not on file   Years of education: Not on file   Highest education level: Not on file  Occupational History   Not on file  Tobacco Use   Smoking status: Former    Current packs/day: 0.00    Types: Cigarettes    Start date: 06/30/2012    Quit date: 12/29/2013    Years since quitting: 10.0    Passive exposure: Current   Smokeless tobacco: Never  Vaping Use   Vaping status: Never Used  Substance and Sexual Activity   Alcohol use: No   Drug use: No   Sexual activity: Not on file  Other Topics Concern   Not on file  Social History Narrative   Not on file   Social Drivers of Health   Financial Resource Strain: Not on file  Food Insecurity: Low Risk  (11/17/2023)   Received from [facility]   Hunger Vital Sign    Worried About Running Out of Food in the Last Year: Never true    Ran Out of Food in the Last Year: Never true  Transportation Needs: No Transportation Needs (11/17/2023)   Received from [facility]    In the past 12 months, has lack of reliable transportation kept you from medical appointments, meetings, work or from getting things needed for daily living? : No  Physical Activity: Not on file  Stress: Not on file  Social Connections: Unknown (02/28/2022)   Received from [facility]   Social Network    Social Network: Not on file   Family History  Problem Relation Age of Onset   Thyroid disease Mother    Diabetes Mother    Hypertension Mother    Hypertension Father    Thyroid disease Father    Thyroid disease Sister  Thyroid disease Brother    Thyroid disease Brother    Outpatient Encounter Medications as of 01/11/2024  Medication Sig   amLODipine (NORVASC) 10 MG tablet Take 10 mg by mouth every evening.   baclofen (LIORESAL) 10 MG tablet Take 10 mg by mouth 2 (two) times daily.   cyclobenzaprine (FLEXERIL) 10 MG tablet Take 10 mg by mouth 3 (three) times daily as needed.   FLOVENT HFA 44 MCG/ACT inhaler Inhale 2 puffs into the lungs 2 (two) times daily.   fluticasone (FLONASE) 50 MCG/ACT nasal spray Place 2 sprays into both nostrils daily.   gabapentin (NEURONTIN) 300 MG capsule Take 300 mg by mouth 3 (three) times daily.   hydrOXYzine (VISTARIL) 25 MG capsule Take 25 mg by mouth 3 (three) times daily as needed.   levonorgestrel (MIRENA) 20 MCG/DAY IUD 1 each by Intrauterine route once.   lisinopril (ZESTRIL) 20 MG tablet Take 20 mg by mouth daily.   meloxicam (MOBIC) 15 MG tablet Take 1 tablet (15 mg total) by mouth daily. (Patient taking differently: Take 15 mg by mouth every evening.)   metoprolol succinate (TOPROL-XL) 25 MG 24 hr tablet Take 25 mg by mouth daily.   ondansetron (ZOFRAN-ODT) 4 MG disintegrating  tablet Take 4 mg by mouth every 8 (eight) hours as needed.   prazosin (MINIPRESS) 2 MG capsule Take 4 mg by mouth at bedtime.   QUEtiapine (SEROQUEL) 100 MG tablet Take 120 mg by mouth at bedtime.   rizatriptan (MAXALT) 5 MG tablet SMARTSIG:1 Tablet(s) By Mouth 1 to 2 Times Daily   Selenium 200 MCG CAPS Take 200 mcg by mouth daily.   trimethoprim (TRIMPEX) 100 MG tablet Take 100 mg by mouth daily.   WEGOVY 0.25 MG/0.5ML SOAJ Inject 1 mg into the skin once a week.   No facility-administered encounter medications on file as of 01/11/2024.   ALLERGIES: Allergies  Allergen Reactions   Amoxicillin Rash   Penicillin G Rash    VACCINATION STATUS: Immunization History  Administered Date(s) Administered   Moderna Sars-Covid-2 Vaccination 01/25/2020, 02/22/2020   Tdap 03/06/2015, 03/06/2015    HPI Grace Sheppard is 28 y.o. female who presents today with a medical history as above. she is being seen in consultation for prediabetes requested by Trisha Mangle, FNP.  Patient also has medical history of resistant hypertension for which she is seeing cardiologist in [place].   She was diagnosed with prediabetes back in July 2024 with A1c of 5.7%.  Related to her high BMI, she was given [facility] intervention which seems to have helped significantly allowing her to lose weight and her point-of-care A1c was 5.3% today.  She is not monitoring blood glucose regularly. She is tolerating her medication currently at 1 mg subcutaneously weekly. She has no acute complaints today.  Her medical history of persistent hypertension requiring 4 medications currently including amlodipine 10 mg p.o. daily, lisinopril 20 mg p.o. daily, metoprolol 25 mg daily, prazosin 4 mg p.o. daily. It appears that she was worked up for hyperaldosteronism with inconclusive results.  She is not on spironolactone or diuretic on her current medications. Review of notes from cardiologist stated that she was supposed to be on  spironolactone and chlorthalidone.  Patient states that this is the next step.  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 She reports that multiple family members have hypertension including her siblings as well as both parents. She does not report any familial  syndromes to explain this hypertension trend. She does not follow any particular diet or exercise program. Her other medical problems include anxiety and depression, obesity, chronic pain syndrome, PTSD.   Review of Systems  Constitutional: + Progressive weight loss,  no fatigue, no subjective hyperthermia, no subjective hypothermia Eyes: no blurry vision, no xerophthalmia ENT: no sore throat, no nodules palpated in throat, no dysphagia/odynophagia, no hoarseness Cardiovascular: no Chest Pain, no Shortness of Breath, no palpitations, no leg swelling Respiratory: no cough, no shortness of breath Gastrointestinal: no Nausea/Vomiting/Diarhhea Musculoskeletal: no muscle/joint aches Skin: no rashes Neurological: no tremors, no numbness, no tingling, no dizziness Psychiatric: no depression, no anxiety  Objective:       01/11/2024   10:05 AM 06/06/2023    3:30 PM 06/06/2023    2:45 PM  Vitals with BMI  Height 5\' 4"     Weight 195 lbs 13 oz    BMI 33.59    Systolic 162 151 161  Diastolic 80 102 98  Pulse 76 99 92    BP (!) 162/80   Pulse 76   Ht 5\' 4"  (1.626 m)   Wt 195 lb 12.8 oz (88.8 kg)   BMI 33.61 kg/m   Wt Readings from Last 3 Encounters:  01/11/24 195 lb 12.8 oz (88.8 kg)  06/06/23 219 lb 11.2 oz (99.7 kg)  03/09/23 215 lb 9.6 oz (97.8 kg)    Physical Exam  Constitutional:  Body mass index is 33.61 kg/m.,  not in acute distress, normal state of mind Eyes: PERRLA, EOMI, no exophthalmos ENT: moist mucous membranes, no gross thyromegaly, no gross cervical lymphadenopathy Cardiovascular: normal precordial activity, Regular Rate and Rhythm,  no Murmur/Rubs/Gallops Respiratory:  adequate breathing efforts, no gross chest deformity, Clear to auscultation bilaterally Gastrointestinal: abdomen soft, Non -tender, No distension, Bowel Sounds present, no gross organomegaly Musculoskeletal: + Low arm and leg cast on left, walks with a cane for dyslipidemia , no peripheral edema Skin: moist, warm, no rashes Neurological: no tremor with outstretched hands, Deep tendon reflexes normal in bilateral lower extremities.  CMP ( most recent) CMP     Component Value Date/Time   NA 139 06/06/2023 1043   NA 143 01/01/2018 1641   K 3.5 06/06/2023 1043   CL 102 06/06/2023 1043   CO2 23 01/01/2018 1641   GLUCOSE 84 06/06/2023 1043   BUN 13 06/06/2023 1043   BUN 11 01/01/2018 1641   CREATININE 0.80 06/06/2023 1043   CALCIUM 10.3 (H) 01/01/2018 1641   PROT 8.1 01/01/2018 1641   ALBUMIN 5.4 01/01/2018 1641   AST 16 01/01/2018 1641   ALT 15 01/01/2018 1641   ALKPHOS 67 01/01/2018 1641   BILITOT 0.2 01/01/2018 1641   GFRNONAA 118 01/01/2018 1641     Diabetic Labs (most recent): Lab Results  Component Value Date   HGBA1C 5.3 (A) 01/11/2024        Lab Results  Component Value Date   TSH 1.030 01/01/2018      Assessment & Plan:   1. Obesity (BMI 30-39.9) (Primary) 2. Prediabetes 3. Resistant hypertension   - Grace Sheppard  is being seen at a kind request of Trisha Mangle, FNP. - I have reviewed her available  records and clinically evaluated the patient. - Based on these reviews, she has prediabetes, obesity, persistent hypertension.  She is responding to Ozempic which allowed her to lose significant weight and lowering her A1c from 5.7% to 5.3%.  Since she is tolerating this medication, she is advised  to continue Ozempic 1 mg subcutaneously weekly.  -Regarding her resistant hypertension: She is on 4 medications including amlodipine 10 mg daily, lisinopril 20 mg p.o. daily, metoprolol 25 mg p.o. daily, prazosin 4 mg  p.o. daily. She is being seen and followed by cardiology at [facility] and her next appointment is coming up on January 22, 2024.  She is encouraged to keep that appointment.  She already had a CVA, remains at high risk for cardiovascular complications.  It appears that she was worked up for hyperaldosteronism in the past with unequivocal results. Her current medication list did not include a diuretic or spironolactone curiously, she was not on a diuretic nor spironolactone.  A note from her cardiologist indicated that she was supposed to be on spironolactone and chlorthalidone which would be better regiments to replace at least 1 or 2 of her current medications.  She is also advised on whole food Sheppard-based diet, lowering salt intake,  more exercise as much as she can. She may need a Doppler of her renal arteries to rule out renovascular etiology for hypertension.   - I did not initiate any new prescriptions today. - she is advised to maintain close follow up with Trisha Mangle, FNP for primary care needs.   -Thank you for involving me in the care of this pleasant patient.  Time spent with the patient: 46  minutes, of which >50% was spent in  counseling her about her prediabetes, hypertension and the rest in obtaining information about her symptoms, reviewing her previous labs/studies ( including abstractions from other facilities),  evaluations, and treatments,  and developing a plan to confirm diagnosis and long term treatment based on the latest standards of care/guidelines; and documenting her care.  Grace Sheppard participated in the discussions, expressed understanding, and voiced agreement with the above plans.  All questions were answered to her satisfaction. she is encouraged to contact clinic should she have any questions or concerns prior to her return visit.  Follow up plan: Return in about 6 months (around 07/13/2024) for A1c -NV.   Marquis Lunch, MD [facility]  [facility] [address] [place], [place] [address] Phone: [phone]  Fax: [phone]     01/11/2024, 12:45 PM  This note was partially dictated with voice recognition software. Similar sounding words can be transcribed inadequately or may not  be corrected upon review.

## 2024-01-22 DIAGNOSIS — I10 Essential (primary) hypertension: Secondary | ICD-10-CM | POA: Diagnosis not present

## 2024-01-24 DIAGNOSIS — M7918 Myalgia, other site: Secondary | ICD-10-CM | POA: Diagnosis not present

## 2024-01-24 DIAGNOSIS — G8929 Other chronic pain: Secondary | ICD-10-CM | POA: Diagnosis not present

## 2024-01-24 DIAGNOSIS — M47816 Spondylosis without myelopathy or radiculopathy, lumbar region: Secondary | ICD-10-CM | POA: Diagnosis not present

## 2024-02-05 DIAGNOSIS — F4312 Post-traumatic stress disorder, chronic: Secondary | ICD-10-CM | POA: Diagnosis not present

## 2024-02-05 DIAGNOSIS — F32A Depression, unspecified: Secondary | ICD-10-CM | POA: Diagnosis not present

## 2024-02-05 DIAGNOSIS — F445 Conversion disorder with seizures or convulsions: Secondary | ICD-10-CM | POA: Diagnosis not present

## 2024-02-05 DIAGNOSIS — F333 Major depressive disorder, recurrent, severe with psychotic symptoms: Secondary | ICD-10-CM | POA: Diagnosis not present

## 2024-02-05 DIAGNOSIS — Z79899 Other long term (current) drug therapy: Secondary | ICD-10-CM | POA: Diagnosis not present

## 2024-02-05 DIAGNOSIS — F411 Generalized anxiety disorder: Secondary | ICD-10-CM | POA: Diagnosis not present

## 2024-02-07 DIAGNOSIS — R110 Nausea: Secondary | ICD-10-CM | POA: Diagnosis not present

## 2024-02-07 DIAGNOSIS — R5383 Other fatigue: Secondary | ICD-10-CM | POA: Diagnosis not present

## 2024-02-07 DIAGNOSIS — R42 Dizziness and giddiness: Secondary | ICD-10-CM | POA: Diagnosis not present

## 2024-03-01 DIAGNOSIS — M47816 Spondylosis without myelopathy or radiculopathy, lumbar region: Secondary | ICD-10-CM | POA: Diagnosis not present

## 2024-03-05 DIAGNOSIS — M47816 Spondylosis without myelopathy or radiculopathy, lumbar region: Secondary | ICD-10-CM | POA: Diagnosis not present

## 2024-07-16 ENCOUNTER — Ambulatory Visit: Admitting: "Endocrinology

## 2024-08-09 ENCOUNTER — Other Ambulatory Visit: Payer: Self-pay | Admitting: Medical Genetics

## 2024-08-13 ENCOUNTER — Encounter: Payer: Self-pay | Admitting: "Endocrinology

## 2024-09-25 ENCOUNTER — Ambulatory Visit: Admitting: "Endocrinology

## 2024-10-18 ENCOUNTER — Ambulatory Visit: Admitting: Nurse Practitioner

## 2024-11-04 ENCOUNTER — Other Ambulatory Visit: Payer: Self-pay | Admitting: "Endocrinology

## 2024-11-04 ENCOUNTER — Ambulatory Visit (INDEPENDENT_AMBULATORY_CARE_PROVIDER_SITE_OTHER): Admitting: "Endocrinology

## 2024-11-04 ENCOUNTER — Encounter: Payer: Self-pay | Admitting: "Endocrinology

## 2024-11-04 ENCOUNTER — Telehealth: Payer: Self-pay

## 2024-11-04 ENCOUNTER — Other Ambulatory Visit (HOSPITAL_COMMUNITY): Payer: Self-pay

## 2024-11-04 VITALS — BP 124/88 | HR 76 | Ht 64.0 in | Wt 197.8 lb

## 2024-11-04 DIAGNOSIS — R7303 Prediabetes: Secondary | ICD-10-CM | POA: Diagnosis not present

## 2024-11-04 DIAGNOSIS — E669 Obesity, unspecified: Secondary | ICD-10-CM | POA: Diagnosis not present

## 2024-11-04 DIAGNOSIS — I10 Essential (primary) hypertension: Secondary | ICD-10-CM | POA: Insufficient documentation

## 2024-11-04 LAB — POCT GLYCOSYLATED HEMOGLOBIN (HGB A1C): HbA1c, POC (prediabetic range): 5.7 % (ref 5.7–6.4)

## 2024-11-04 MED ORDER — ZEPBOUND 2.5 MG/0.5ML ~~LOC~~ SOAJ: 2.5000 mg | SUBCUTANEOUS | 0 refills | Status: AC

## 2024-11-04 NOTE — Progress Notes (Signed)
 "                                                     11/04/2024, 1:20 PM   Endocrinology follow-up note  Subjective:    Patient ID: Grace Sheppard, female    DOB: June 11, 1996, PCP Jesus Elberta Gainer, FNP   Past Medical History:  Diagnosis Date   Anxiety    Asthma    Chronic pain syndrome    Conversion disorder    Does mobilize using cane    Fibromyalgia    Hypertension    Insomnia    Interstitial cystitis    Migraine    Muscle weakness of left arm    wears brace, from conversion disorder   Neuropathy    toes and left hand   Plantar fasciitis    both feet   Post partum depression    Prediabetes 2024   Pseudoseizures 2014   2014 none since   PTSD (post-traumatic stress disorder)    Syncope    recurrent   Wears glasses    Past Surgical History:  Procedure Laterality Date   CHOLECYSTECTOMY     yrs ago   CYSTO WITH HYDRODISTENSION N/A 06/06/2023   Procedure: CYSTOSCOPY/HYDRODISTENSION INSTILL MARCAINE  AND PYRIDIUM ;  Surgeon: Gaston Hamilton, MD;  Location: [facility];  Service: Urology;  Laterality: N/A;   TUBAL LIGATION     yrs ago   Social History   Socioeconomic History   Marital status: Legally Separated    Spouse name: Not on file   Number of children: Not on file   Years of education: Not on file   Highest education level: Not on file  Occupational History   Not on file  Tobacco Use   Smoking status: Former    Current packs/day: 0.00    Types: Cigarettes    Start date: 06/30/2012    Quit date: 12/29/2013    Years since quitting: 10.8    Passive exposure: Current   Smokeless tobacco: Never  Vaping Use   Vaping status: Never Used  Substance and Sexual Activity   Alcohol  use: No   Drug use: No   Sexual activity: Not on file  Other Topics Concern   Not on file  Social History Narrative   Not on file   Social Drivers of Health   Tobacco Use: Medium Risk (11/04/2024)   Patient History    Smoking Tobacco Use: Former    Smokeless  Tobacco Use: Never    Passive Exposure: Current  Physicist, Medical Strain: Low Risk (01/22/2024)   Received from [facility]   Overall Financial Resource Strain (CARDIA)    Difficulty of Paying Living Expenses: Not very hard  Food Insecurity: No Food Insecurity (10/11/2024)   Received from [facility]   Epic    Within the past 12 months, you worried that your food would run out before you got the money to buy more.: Never true    Within the past 12 months, the food you bought just didn't last and you didn't have money to get more.: Never true  Transportation Needs: No Transportation Needs (10/11/2024)   Received from [facility]   PRAPARE - Transportation    Lack of Transportation (Medical): No    Lack of Transportation (Non-Medical): No  Physical Activity: Not on file  Stress: Not on file  Social Connections: Not on file  Depression (EYV7-0): Not on file  Alcohol  Screen: Not on file  Housing: Low Risk (08/02/2024)   Received from [facility]    Think about the place you live. Do you have problems with any of the following? Choose all that apply:: None/None on this list    What is your living situation today?: I have a steady place to live  Utilities: Low Risk (10/11/2024)   Received from [facility]   Utilities    Within the past 12 months, have you been unable to get utilities(heat, electricity) when it was really needed?: No  Health Literacy: Not on file   Family History  Problem Relation Age of Onset   Thyroid disease Mother    Diabetes Mother    Hypertension Mother    Hypertension Father    Thyroid disease Father    Thyroid disease Sister    Thyroid disease Brother    Thyroid disease Brother    Outpatient Encounter Medications as of 11/04/2024  Medication Sig   tirzepatide  (ZEPBOUND ) 2.5 MG/0.5ML Pen Inject 2.5 mg into the skin once a week.   amLODipine (NORVASC) 10 MG tablet Take 10 mg by mouth every evening.   baclofen (LIORESAL) 10 MG  tablet Take 10 mg by mouth 2 (two) times daily.   cyclobenzaprine (FLEXERIL) 10 MG tablet Take 10 mg by mouth 3 (three) times daily as needed.   FLOVENT HFA 44 MCG/ACT inhaler Inhale 2 puffs into the lungs 2 (two) times daily.   fluticasone (FLONASE) 50 MCG/ACT nasal spray Place 2 sprays into both nostrils daily.   gabapentin (NEURONTIN) 300 MG capsule Take 300 mg by mouth 3 (three) times daily.   hydrOXYzine (VISTARIL) 25 MG capsule Take 25 mg by mouth 3 (three) times daily as needed.   levonorgestrel (MIRENA) 20 MCG/DAY IUD 1 each by Intrauterine route once.   lisinopril (ZESTRIL) 20 MG tablet Take 20 mg by mouth daily.   meloxicam  (MOBIC ) 15 MG tablet Take 1 tablet (15 mg total) by mouth daily. (Patient taking differently: Take 15 mg by mouth every evening.)   metoprolol succinate (TOPROL-XL) 25 MG 24 hr tablet Take 25 mg by mouth daily.   ondansetron  (ZOFRAN -ODT) 4 MG disintegrating tablet Take 4 mg by mouth every 8 (eight) hours as needed.   prazosin (MINIPRESS) 2 MG capsule Take 4 mg by mouth at bedtime.   QUEtiapine (SEROQUEL) 100 MG tablet Take 120 mg by mouth at bedtime.   rizatriptan (MAXALT) 5 MG tablet SMARTSIG:1 Tablet(s) By Mouth 1 to 2 Times Daily   Selenium 200 MCG CAPS Take 200 mcg by mouth daily.   trimethoprim (TRIMPEX) 100 MG tablet Take 100 mg by mouth daily.   [DISCONTINUED] WEGOVY 0.25 MG/0.5ML SOAJ Inject 1 mg into the skin once a week. (Patient not taking: Reported on 11/04/2024)   No facility-administered encounter medications on file as of 11/04/2024.   ALLERGIES: Allergies  Allergen Reactions   Amoxicillin Rash   Penicillin G Rash    VACCINATION STATUS: Immunization History  Administered Date(s) Administered   Moderna Sars-Covid-2 Vaccination 01/25/2020, 02/22/2020   Tdap 03/06/2015, 03/06/2015    HPI Grace Sheppard is 29 y.o. female who presents today with a medical history as above. she is being seen in follow-up for management of obesity, prediabetes.     She was diagnosed with prediabetes with point-of-care A1c today at stable at 5.7%.  She has responded to GLP-1  receptor agonists until her last visit.  More recently she was without her GLP-1 receptor agonist supplies due to a change in her insurance.  She has no new insurance dual coverage from [facility] and Medicare and would like to be considered for GLP-1 receptor agonists.    She has no acute complaints today.  She presents with mildly fluctuating white weight.  She has well-controlled hypertension, currently on amlodipine, lisinopril, and metoprolol.    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 She reports that multiple family members have hypertension including her siblings as well as both parents. She does not report any familial syndromes to explain this hypertension trend. She does not follow any particular diet or exercise program. Her other medical problems include anxiety and depression, obesity, chronic pain syndrome, PTSD.   Review of Systems  Constitutional: + Progressive weight loss,  no fatigue, no subjective hyperthermia, no subjective hypothermia Eyes: no blurry vision, no xerophthalmia   Objective:       11/04/2024   11:01 AM 01/11/2024   10:05 AM 06/06/2023    3:30 PM  Vitals with BMI  Height 5' 4 5' 4   Weight 197 lbs 13 oz 195 lbs 13 oz   BMI 33.94 33.59   Systolic 124 162 848  Diastolic 88 80 102  Pulse 76 76 99    BP 124/88   Pulse 76   Ht 5' 4 (1.626 m)   Wt 197 lb 12.8 oz (89.7 kg)   BMI 33.95 kg/m   Wt Readings from Last 3 Encounters:  11/04/24 197 lb 12.8 oz (89.7 kg)  01/11/24 195 lb 12.8 oz (88.8 kg)  06/06/23 219 lb 11.2 oz (99.7 kg)    Physical Exam  Constitutional:  Body mass index is 33.95 kg/m.,  not in acute distress, normal state of mind Eyes: PERRLA, EOMI, no exophthalmos  Musculoskeletal: + L arm and leg cast on left, walks with a cane for  dyslipidemia , no peripheral edema Skin: moist, warm, no rashes Neurological: no tremor with outstretched hands, Deep tendon reflexes normal in bilateral lower extremities.  CMP ( most recent) CMP     Component Value Date/Time   NA 139 06/06/2023 1043   NA 143 01/01/2018 1641   K 3.5 06/06/2023 1043   CL 102 06/06/2023 1043   CO2 23 01/01/2018 1641   GLUCOSE 84 06/06/2023 1043   BUN 13 06/06/2023 1043   BUN 11 01/01/2018 1641   CREATININE 0.80 06/06/2023 1043   CALCIUM 10.3 (H) 01/01/2018 1641   PROT 8.1 01/01/2018 1641   ALBUMIN 5.4 01/01/2018 1641   AST 16 01/01/2018 1641   ALT 15 01/01/2018 1641   ALKPHOS 67 01/01/2018 1641   BILITOT 0.2 01/01/2018 1641   GFRNONAA 118 01/01/2018 1641     Diabetic Labs (most recent): Lab Results  Component Value Date   HGBA1C 5.7 11/04/2024   HGBA1C 5.3 (A) 01/11/2024        Lab Results  Component Value Date   TSH 1.030 01/01/2018      Assessment & Plan:   1. Obesity (BMI 30-39.9) (Primary) 2. Prediabetes 3. hypertension   - Elsia L Salmon  is being seen at a kind request of Jesus Elberta Gainer, FNP. - I have reviewed her available  records and clinically evaluated the patient. - Based on these reviews, she has prediabetes, obesity, persistent hypertension.  She has responded to GLP-1 receptor agonist in  the past.  She may benefit from resumption of this product.  I discussed and prescribed Zepbound  2.5 mg subcutaneously weekly.  This medication will be advanced as she tolerates.    Her point-of-care A1c is 5.7% still consistent with prediabetes.  She will not need additional intervention patient if she gets coverage for the GLP-1's agonist.  -Regarding her hypertension: She is on 3 medications including amlodipine 10 mg daily, lisinopril 20 mg p.o. daily, metoprolol 25 mg p.o. daily.  She is encouraged to stay on this medications and follow-up with her cardiologist.  She is also advised on whole food plant-based diet,  lowering salt intake,  more exercise as much as she can.   - she is advised to maintain close follow up with Jesus Elberta Gainer, FNP for primary care needs.  I spent  25  minutes in the care of the patient today including review of labs from Thyroid Function, CMP, and other relevant labs ; imaging/biopsy records (current and previous including abstractions from other facilities); face-to-face time discussing  her lab results and symptoms, medications doses, her options of short and long term treatment based on the latest standards of care / guidelines;   and documenting the encounter.  Tyreona L Cabanilla  participated in the discussions, expressed understanding, and voiced agreement with the above plans.  All questions were answered to her satisfaction. she is encouraged to contact clinic should she have any questions or concerns prior to her return visit. Dear Patient: Feel free to review your progress notes.  If you are reviewing this progress note and have questions about the meaning of /or medical terms being used, please make a note and address it at your next follow-up appointment.  Medical notes are meant to be a communication tool between medical professionals and require medical terms to be used for efficiency and insurance approval.   Follow up plan: Return in about 3 months (around 02/02/2025) for Fasting Labs  in AM B4 8, A1c -NV.   Ranny Earl, MD [facility] [address] [place], [place] [address] Phone: [phone]  Fax: [phone]     11/04/2024, 1:20 PM  This note was partially dictated with voice recognition software. Similar sounding words can be transcribed inadequately or may not  be corrected upon review.  "

## 2024-11-04 NOTE — Patient Instructions (Signed)

## 2024-11-04 NOTE — Telephone Encounter (Signed)
 Pharmacy Patient Advocate Encounter   Received notification from RX Request Messages that prior authorization for zepbound  2.5mg /0.40ml is required/requested.   Insurance verification completed.   The patient is insured through [facility].   Per test claim: PA required; PA started via CoverMyMeds. KEY [id number] . Please see clinical question(s) below that I am not finding the answer to in their chart and advise.   I see documentation that patient has a sleep study and found NOT to have OSA. Her insurance will only cover with this dx.

## 2024-11-05 ENCOUNTER — Other Ambulatory Visit: Payer: Self-pay | Admitting: "Endocrinology

## 2024-11-06 ENCOUNTER — Other Ambulatory Visit: Payer: Self-pay | Admitting: "Endocrinology

## 2025-02-04 ENCOUNTER — Ambulatory Visit: Admitting: "Endocrinology
